# Patient Record
Sex: Female | Born: 2000 | Race: Black or African American | Hispanic: No | Marital: Single | Smoking: Never smoker
Health system: Southern US, Community
[De-identification: ages and names within clinical notes are randomized; demographics above are authoritative.]

## PROBLEM LIST (undated history)

## (undated) DIAGNOSIS — D649 Anemia, unspecified: Secondary | ICD-10-CM

---
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
## 2020-02-24 DIAGNOSIS — R45851 Suicidal ideations: Secondary | ICD-10-CM | POA: Insufficient documentation

## 2020-11-04 ENCOUNTER — Encounter (HOSPITAL_COMMUNITY): Payer: Self-pay | Admitting: Emergency Medicine

## 2020-11-04 ENCOUNTER — Emergency Department (HOSPITAL_COMMUNITY)
Admission: EM | Admit: 2020-11-04 | Discharge: 2020-11-05 | Disposition: A | Discharge status: Home / Self Care | Payer: BC Managed Care – PPO | Attending: Emergency Medicine | Admitting: Emergency Medicine

## 2020-11-04 ENCOUNTER — Other Ambulatory Visit: Payer: Self-pay

## 2020-11-04 DIAGNOSIS — N939 Abnormal uterine and vaginal bleeding, unspecified: Secondary | ICD-10-CM | POA: Diagnosis not present

## 2020-11-04 DIAGNOSIS — N3000 Acute cystitis without hematuria: Secondary | ICD-10-CM

## 2020-11-04 HISTORY — DX: Anemia, unspecified: D64.9

## 2020-11-04 LAB — BASIC METABOLIC PANEL
Anion gap: 10 (ref 5–15)
BUN: 5 mg/dL — ABNORMAL LOW (ref 6–20)
CO2: 24 mmol/L (ref 22–32)
Calcium: 9.7 mg/dL (ref 8.9–10.3)
Chloride: 103 mmol/L (ref 98–111)
Creatinine, Ser: 0.77 mg/dL (ref 0.44–1.00)
GFR, Estimated: >60 mL/min (ref >60)
Glucose, Bld: 79 mg/dL (ref 70–99)
Potassium: 4 mmol/L (ref 3.5–5.1)
Sodium: 137 mmol/L (ref 135–145)

## 2020-11-04 LAB — CBC WITH DIFFERENTIAL/PLATELET
Abs Immature Granulocytes: 0.01 10*3/uL (ref 0.00–0.07)
Basophils Absolute: 0.1 10*3/uL (ref 0.0–0.1)
Basophils Relative: 1 %
Eosinophils Absolute: 0.2 10*3/uL (ref 0.0–0.5)
Eosinophils Relative: 3 %
HCT: 33.3 % — ABNORMAL LOW (ref 36.0–46.0)
Hemoglobin: 9.8 g/dL — ABNORMAL LOW (ref 12.0–15.0)
Immature Granulocytes: 0 %
Lymphocytes Relative: 45 %
Lymphs Abs: 2.9 10*3/uL (ref 0.7–4.0)
MCH: 22.4 pg — ABNORMAL LOW (ref 26.0–34.0)
MCHC: 29.4 g/dL — ABNORMAL LOW (ref 30.0–36.0)
MCV: 76 fL — ABNORMAL LOW (ref 80.0–100.0)
Monocytes Absolute: 0.5 10*3/uL (ref 0.1–1.0)
Monocytes Relative: 7 %
Neutro Abs: 2.9 10*3/uL (ref 1.7–7.7)
Neutrophils Relative %: 44 %
Platelets: 304 10*3/uL (ref 150–400)
RBC: 4.38 MIL/uL (ref 3.87–5.11)
RDW: 16.4 % — ABNORMAL HIGH (ref 11.5–15.5)
WBC: 6.6 10*3/uL (ref 4.0–10.5)
nRBC: 0 % (ref 0.0–0.2)

## 2020-11-04 LAB — I-STAT BETA HCG BLOOD, ED (MC, WL, AP ONLY): I-stat hCG, quantitative: <5 m[IU]/mL (ref <5)

## 2020-11-04 LAB — URINALYSIS, ROUTINE W REFLEX MICROSCOPIC
Bilirubin Urine: NEGATIVE
Glucose, UA: NEGATIVE mg/dL
Ketones, ur: NEGATIVE mg/dL
Nitrite: POSITIVE — AB
Protein, ur: 30 mg/dL — AB
RBC / HPF: >50 RBC/hpf — ABNORMAL HIGH (ref 0–5)
Specific Gravity, Urine: 1.015 (ref 1.005–1.030)
pH: 6 (ref 5.0–8.0)

## 2020-11-04 NOTE — ED Triage Notes (Signed)
Patient reports vaginal bleeding for 3 days with dysuria , no fever or chills , denies emesis .

## 2020-11-05 ENCOUNTER — Telehealth: Payer: Self-pay | Admitting: *Deleted

## 2020-11-05 ENCOUNTER — Encounter (HOSPITAL_COMMUNITY): Payer: Self-pay | Admitting: Emergency Medicine

## 2020-11-05 LAB — GC/CHLAMYDIA PROBE AMP (~~LOC~~) NOT AT ARMC
Chlamydia: NEGATIVE
Comment: NEGATIVE
Comment: NORMAL
Neisseria Gonorrhea: NEGATIVE

## 2020-11-05 LAB — WET PREP, GENITAL
Clue Cells Wet Prep HPF POC: NONE SEEN
Sperm: NONE SEEN
Trich, Wet Prep: NONE SEEN

## 2020-11-05 MED ORDER — CEPHALEXIN 500 MG PO CAPS: 500.0000 mg | ORAL_CAPSULE | Freq: Four times a day (QID) | ORAL | 0 refills | Status: DC

## 2020-11-05 MED ORDER — FLUCONAZOLE 150 MG PO TABS: 150.0000 mg | ORAL_TABLET | Freq: Once | ORAL | 0 refills | Status: AC

## 2020-11-05 MED ORDER — CEPHALEXIN 250 MG PO CAPS
500.0000 mg | ORAL_CAPSULE | Freq: Once | ORAL | Status: AC
  Administered 2020-11-05: 500 mg via ORAL
  Filled 2020-11-05: qty 2

## 2020-11-05 MED ORDER — FLUCONAZOLE 150 MG PO TABS: 150.0000 mg | ORAL_TABLET | Freq: Once | ORAL | 0 refills | Status: DC

## 2020-11-05 NOTE — Telephone Encounter (Signed)
TOC CM received call from pt about rx. Explained rx were attached to her AVS, and they are blue. She located them and will take to her pharmacy. Isidoro Donning RN CCM, [facility] ED TOC CM [phone]

## 2020-11-05 NOTE — ED Provider Notes (Signed)
[facility] EMERGENCY DEPARTMENT Provider Note   CSN: [id number] Arrival date & time: 11/04/20  2040     History Chief Complaint  Patient presents with  . Vaginal Bleeding    Ashley Wilkins is a 19 y.o. female.  The history is provided by the patient.  Vaginal Bleeding Quality:  Dark red Severity:  Moderate Onset quality:  Gradual Timing:  Constant Progression:  Unchanged Chronicity:  New Menstrual history:  Irregular Possible pregnancy: no   Context: spontaneously   Relieved by:  Nothing Worsened by:  Nothing Ineffective treatments:  None tried Associated symptoms: dysuria   Associated symptoms: no abdominal pain, no dizziness and no fever   Risk factors: no bleeding disorder, no STD, no STD exposure and no terminated pregnancies   Patient presents with vaginal bleeding this evening having had a period at the beginning of the month with some dysuria.  No f/c/r.  No discharge.      Past Medical History:  Diagnosis Date  . Anemia     There are no problems to display for this patient.   History reviewed. No pertinent surgical history.   OB History   No obstetric history on file.     History reviewed. No pertinent family history.  Social History   Tobacco Use  . Smoking status: Never Smoker  . Smokeless tobacco: Never Used  Substance Use Topics  . Alcohol use: Never  . Drug use: Never    Home Medications Prior to Admission medications   Not on File    Allergies    Patient has no known allergies.  Review of Systems   Review of Systems  Constitutional: Negative for fever.  HENT: Negative for congestion.   Eyes: Negative for visual disturbance.  Respiratory: Negative for shortness of breath.   Cardiovascular: Negative for chest pain.  Gastrointestinal: Negative for abdominal pain.  Genitourinary: Positive for dysuria and vaginal bleeding.  Musculoskeletal: Negative for arthralgias.  Skin: Negative for rash.  Neurological:  Negative for dizziness.  Psychiatric/Behavioral: Negative for agitation.  All other systems reviewed and are negative.   Physical Exam Updated Vital Signs BP (!) 148/63   Pulse 84   Temp 98.6 F (37 C) (Oral)   Resp 18   Ht 5\' 8"  (1.727 m)   Wt 113 kg   LMP 10/19/2020   SpO2 100%   BMI 37.88 kg/m   Physical Exam Vitals and nursing note reviewed.  Constitutional:      General: She is not in acute distress.    Appearance: Normal appearance.  HENT:     Head: Normocephalic and atraumatic.     Nose: Nose normal.  Eyes:     Conjunctiva/sclera: Conjunctivae normal.     Pupils: Pupils are equal, round, and reactive to light.  Cardiovascular:     Rate and Rhythm: Normal rate and regular rhythm.     Pulses: Normal pulses.     Heart sounds: Normal heart sounds.  Pulmonary:     Effort: Pulmonary effort is normal.     Breath sounds: Normal breath sounds.  Abdominal:     General: Abdomen is flat. Bowel sounds are normal.     Palpations: Abdomen is soft.     Tenderness: There is no abdominal tenderness. There is no guarding or rebound.     Hernia: No hernia is present.  Musculoskeletal:        General: Normal range of motion.     Cervical back: Normal range of motion and neck  supple.  Skin:    General: Skin is warm and dry.     Capillary Refill: Capillary refill takes less than 2 seconds.  Neurological:     General: No focal deficit present.     Mental Status: She is alert and oriented to person, place, and time.     Deep Tendon Reflexes: Reflexes normal.  Psychiatric:        Mood and Affect: Mood normal.        Behavior: Behavior normal.     ED Results / Procedures / Treatments   Labs (all labs ordered are listed, but only abnormal results are displayed) Results for orders placed or performed during the hospital encounter of 11/04/20  CBC with Differential  Result Value Ref Range   WBC 6.6 4.0 - 10.5 K/uL   RBC 4.38 3.87 - 5.11 MIL/uL   Hemoglobin 9.8 (L) 12.0 -  15.0 g/dL   HCT 77.9 (L) 36 - 46 %   MCV 76.0 (L) 80.0 - 100.0 fL   MCH 22.4 (L) 26.0 - 34.0 pg   MCHC 29.4 (L) 30.0 - 36.0 g/dL   RDW 39.0 (H) 30.0 - 92.3 %   Platelets 304 150 - 400 K/uL   nRBC 0.0 0.0 - 0.2 %   Neutrophils Relative % 44 %   Neutro Abs 2.9 1.7 - 7.7 K/uL   Lymphocytes Relative 45 %   Lymphs Abs 2.9 0.7 - 4.0 K/uL   Monocytes Relative 7 %   Monocytes Absolute 0.5 0.1 - 1.0 K/uL   Eosinophils Relative 3 %   Eosinophils Absolute 0.2 0.0 - 0.5 K/uL   Basophils Relative 1 %   Basophils Absolute 0.1 0.0 - 0.1 K/uL   Immature Granulocytes 0 %   Abs Immature Granulocytes 0.01 0.00 - 0.07 K/uL  Basic metabolic panel  Result Value Ref Range   Sodium 137 135 - 145 mmol/L   Potassium 4.0 3.5 - 5.1 mmol/L   Chloride 103 98 - 111 mmol/L   CO2 24 22 - 32 mmol/L   Glucose, Bld 79 70 - 99 mg/dL   BUN 5 (L) 6 - 20 mg/dL   Creatinine, Ser 3.00 0.44 - 1.00 mg/dL   Calcium 9.7 8.9 - 76.2 mg/dL   GFR, Estimated >26 >33 mL/min   Anion gap 10 5 - 15  Urinalysis, Routine w reflex microscopic Urine, Clean Catch  Result Value Ref Range   Color, Urine YELLOW YELLOW   APPearance HAZY (A) CLEAR   Specific Gravity, Urine 1.015 1.005 - 1.030   pH 6.0 5.0 - 8.0   Glucose, UA NEGATIVE NEGATIVE mg/dL   Hgb urine dipstick LARGE (A) NEGATIVE   Bilirubin Urine NEGATIVE NEGATIVE   Ketones, ur NEGATIVE NEGATIVE mg/dL   Protein, ur 30 (A) NEGATIVE mg/dL   Nitrite POSITIVE (A) NEGATIVE   Leukocytes,Ua TRACE (A) NEGATIVE   RBC / HPF >50 (H) 0 - 5 RBC/hpf   WBC, UA 11-20 0 - 5 WBC/hpf   Bacteria, UA FEW (A) NONE SEEN   Squamous Epithelial / LPF 0-5 0 - 5   Mucus PRESENT   I-Stat Beta hCG blood, ED ([facility], [facility], [facility] only)  Result Value Ref Range   I-stat hCG, quantitative <5.0 <5 mIU/mL   Comment 3           No results found.  EKG None  Radiology No results found.  Procedures Procedures (including critical care time)  Medications Ordered in ED Medications  cephALEXin (KEFLEX)  capsule 500  mg (has no administration in time range)    ED Course  I have reviewed the triage vital signs and the nursing notes.  Pertinent labs & imaging results that were available during my care of the patient were reviewed by me and considered in my medical decision making (see chart for details).   No instrumentation, no recent pregnancy.  Likely change of menstrual cycle. Patient has a UTI which we are treating with keflex.  If vaginal bleeding persists follow up with GYN.     Brycelyn Rayfield was evaluated in Emergency Department on 11/05/2020 for the symptoms described in the history of present illness. She was evaluated in the context of the global COVID-19 pandemic, which necessitated consideration that the patient might be at risk for infection with the SARS-CoV-2 virus that causes COVID-19. Institutional protocols and algorithms that pertain to the evaluation of patients at risk for COVID-19 are in a state of rapid change based on information released by regulatory bodies including the CDC and federal and state organizations. These policies and algorithms were followed during the patient's care in the ED.  Final Clinical Impression(s) / ED Diagnoses Return for intractable cough, coughing up blood,fevers >100.4 unrelieved by medication, shortness of breath, intractable vomiting, chest pain, shortness of breath, weakness,numbness, changes in speech, facial asymmetry,abdominal pain, passing out,Inability to tolerate liquids or food, cough, altered mental status or any concerns. No signs of systemic illness or infection. The patient is nontoxic-appearing on exam and vital signs are within normal limits.   I have reviewed the triage vital signs and the nursing notes. Pertinent labs &imaging results that were available during my care of the patient were reviewed by me and considered in my medical decision making (see chart for details).After history, exam, and medical workup I feel the  patient has beenappropriately medically screened and is safe for discharge home. Pertinent diagnoses were discussed with the patient. Patient was given return precautions.   Keian Odriscoll, MD 11/05/20 [id number]

## 2021-04-12 ENCOUNTER — Other Ambulatory Visit: Payer: Self-pay

## 2021-04-12 ENCOUNTER — Emergency Department (HOSPITAL_COMMUNITY): Payer: 59

## 2021-04-12 ENCOUNTER — Encounter (HOSPITAL_COMMUNITY): Payer: Self-pay

## 2021-04-12 ENCOUNTER — Emergency Department (HOSPITAL_COMMUNITY)
Admission: EM | Admit: 2021-04-12 | Discharge: 2021-04-12 | Disposition: A | Discharge status: Home / Self Care | Payer: 59 | Attending: Emergency Medicine | Admitting: Emergency Medicine

## 2021-04-12 DIAGNOSIS — Z20822 Contact with and (suspected) exposure to covid-19: Secondary | ICD-10-CM | POA: Diagnosis not present

## 2021-04-12 DIAGNOSIS — R112 Nausea with vomiting, unspecified: Secondary | ICD-10-CM | POA: Insufficient documentation

## 2021-04-12 DIAGNOSIS — R0989 Other specified symptoms and signs involving the circulatory and respiratory systems: Secondary | ICD-10-CM | POA: Diagnosis not present

## 2021-04-12 DIAGNOSIS — R002 Palpitations: Secondary | ICD-10-CM | POA: Insufficient documentation

## 2021-04-12 DIAGNOSIS — R1084 Generalized abdominal pain: Secondary | ICD-10-CM | POA: Insufficient documentation

## 2021-04-12 DIAGNOSIS — R638 Other symptoms and signs concerning food and fluid intake: Secondary | ICD-10-CM | POA: Diagnosis not present

## 2021-04-12 DIAGNOSIS — R000 Tachycardia, unspecified: Secondary | ICD-10-CM | POA: Insufficient documentation

## 2021-04-12 DIAGNOSIS — M791 Myalgia, unspecified site: Secondary | ICD-10-CM | POA: Diagnosis not present

## 2021-04-12 LAB — CBC WITH DIFFERENTIAL/PLATELET
Abs Immature Granulocytes: 0.04 10*3/uL (ref 0.00–0.07)
Basophils Absolute: 0 10*3/uL (ref 0.0–0.1)
Basophils Relative: 0 %
Eosinophils Absolute: 0.1 10*3/uL (ref 0.0–0.5)
Eosinophils Relative: 1 %
HCT: 28.9 % — ABNORMAL LOW (ref 36.0–46.0)
Hemoglobin: 8.7 g/dL — ABNORMAL LOW (ref 12.0–15.0)
Immature Granulocytes: 0 %
Lymphocytes Relative: 25 %
Lymphs Abs: 2.4 10*3/uL (ref 0.7–4.0)
MCH: 21.6 pg — ABNORMAL LOW (ref 26.0–34.0)
MCHC: 30.1 g/dL (ref 30.0–36.0)
MCV: 71.9 fL — ABNORMAL LOW (ref 80.0–100.0)
Monocytes Absolute: 0.8 10*3/uL (ref 0.1–1.0)
Monocytes Relative: 8 %
Neutro Abs: 6.2 10*3/uL (ref 1.7–7.7)
Neutrophils Relative %: 66 %
Platelets: 268 10*3/uL (ref 150–400)
RBC: 4.02 MIL/uL (ref 3.87–5.11)
RDW: 18.2 % — ABNORMAL HIGH (ref 11.5–15.5)
WBC: 9.5 10*3/uL (ref 4.0–10.5)
nRBC: 0 % (ref 0.0–0.2)

## 2021-04-12 LAB — COMPREHENSIVE METABOLIC PANEL
ALT: 16 U/L (ref 0–44)
AST: 19 U/L (ref 15–41)
Albumin: 3.3 g/dL — ABNORMAL LOW (ref 3.5–5.0)
Alkaline Phosphatase: 46 U/L (ref 38–126)
Anion gap: 11 (ref 5–15)
BUN: 9 mg/dL (ref 6–20)
CO2: 24 mmol/L (ref 22–32)
Calcium: 9.2 mg/dL (ref 8.9–10.3)
Chloride: 99 mmol/L (ref 98–111)
Creatinine, Ser: 0.93 mg/dL (ref 0.44–1.00)
GFR, Estimated: >60 mL/min (ref >60)
Glucose, Bld: 111 mg/dL — ABNORMAL HIGH (ref 70–99)
Potassium: 3.5 mmol/L (ref 3.5–5.1)
Sodium: 134 mmol/L — ABNORMAL LOW (ref 135–145)
Total Bilirubin: 0.7 mg/dL (ref 0.3–1.2)
Total Protein: 7.5 g/dL (ref 6.5–8.1)

## 2021-04-12 LAB — RESP PANEL BY RT-PCR (FLU A&B, COVID) ARPGX2
Influenza A by PCR: NEGATIVE
Influenza B by PCR: NEGATIVE
SARS Coronavirus 2 by RT PCR: NEGATIVE

## 2021-04-12 LAB — LIPASE, BLOOD: Lipase: 26 U/L (ref 11–51)

## 2021-04-12 LAB — TSH: TSH: 1.925 u[IU]/mL (ref 0.350–4.500)

## 2021-04-12 LAB — I-STAT BETA HCG BLOOD, ED (MC, WL, AP ONLY): I-stat hCG, quantitative: <5 m[IU]/mL (ref <5)

## 2021-04-12 MED ORDER — SODIUM CHLORIDE 0.9 % IV BOLUS
1000.0000 mL | Freq: Once | INTRAVENOUS | Status: AC
  Administered 2021-04-12: 1000 mL via INTRAVENOUS

## 2021-04-12 MED ORDER — IBUPROFEN 400 MG PO TABS
400.0000 mg | ORAL_TABLET | Freq: Once | ORAL | Status: AC
  Administered 2021-04-12: 400 mg via ORAL
  Filled 2021-04-12: qty 1

## 2021-04-12 MED ORDER — ONDANSETRON 4 MG PO TBDP: 4.0000 mg | ORAL_TABLET | Freq: Three times a day (TID) | ORAL | 0 refills | Status: DC | PRN

## 2021-04-12 NOTE — ED Provider Notes (Signed)
[facility] EMERGENCY DEPARTMENT Provider Note   CSN: [id number] Arrival date & time: 04/12/21  0175     History Chief Complaint  Patient presents with  . Palpitations  . Emesis    Ashley Wilkins is a 20 y.o. female with past medical history significant for anemia.  HPI Patient presents to emergency room today via EMS with chief complaint of palpitations and emesis x1 week.  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  She has had increased stress lately because of school.  She denies any suicidal or homicidal ideations.  She describes her palpitations as feeling like her heart was racing and that she was going to pass out.  Patient also endorses generalized abdominal pain, nausea, emesis, generalized body aches.  She has had 4 episodes of nonbloody nonbilious emesis in the last 24 hours.  She does not she is unable to tolerate any p.o. intake.  She states her last full meal was x3 days ago.  She has been taking Tylenol and Motrin.  Her last dose of Tylenol was just prior to arrival.  Patient states she also has congestion.  She had a negative home COVID test 2 days ago.  Patient denies any family history of cardiac disease.  She states she is supposed to be on iron pills however has not taken them in quite some time.  She denies fever, chills, congestion, hematemesis, chest pain, syncope, urinary symptoms, diarrhea, blood in stool.  Denies any drug or alcohol use.  Last bowel movement was today and was normal.    Past Medical History:  Diagnosis Date  . Anemia     There are no problems to display for this patient.   History reviewed. No pertinent surgical history.   OB History   No obstetric history on file.     History reviewed. No pertinent family history.  Social History   Tobacco Use  . Smoking status: Never Smoker  . Smokeless tobacco: Never Used  Substance Use Topics  . Alcohol use: Never  . Drug use: Never    Home Medications Prior to Admission  medications   Medication Sig Start Date End Date Taking? Authorizing Provider  acetaminophen (TYLENOL) 325 MG tablet Take 650 mg by mouth every 6 (six) hours as needed for mild pain, fever or headache.   Yes [provider]  ibuprofen (ADVIL) 200 MG tablet Take 400 mg by mouth every 6 (six) hours as needed for fever, headache or mild pain.   Yes [provider]  ondansetron (ZOFRAN ODT) 4 MG disintegrating tablet Take 1 tablet (4 mg total) by mouth every 8 (eight) hours as needed for nausea or vomiting. 04/12/21  Yes Walisiewicz, Leslieanne Cobarrubias E, PA-C  WELLBUTRIN XL 150 MG 24 hr tablet Take 150 mg by mouth daily. 02/13/21  Yes [provider]  ziprasidone (GEODON) 40 MG capsule Take 40 mg by mouth at bedtime. 02/13/21  Yes [provider]  cephALEXin (KEFLEX) 500 MG capsule Take 1 capsule (500 mg total) by mouth 4 (four) times daily. Patient not taking: Reported on 04/12/2021 11/05/20   Nicanor Alcon, April, MD    Allergies    Patient has no known allergies.  Review of Systems   Review of Systems All other systems are reviewed and are negative for acute change except as noted in the HPI.  Physical Exam Updated Vital Signs BP 122/80   Pulse (!) 112   Temp 98.2 F (36.8 C) (Oral)   Resp 20  Ht 5\' 8"  (1.727 m)   Wt 99.8 kg   SpO2 99%   BMI 33.45 kg/m   Physical Exam Vitals and nursing note reviewed.  Constitutional:      General: She is not in acute distress.    Appearance: She is not ill-appearing.  HENT:     Head: Normocephalic and atraumatic.     Right Ear: Tympanic membrane and external ear normal.     Left Ear: Tympanic membrane and external ear normal.     Nose: Nose normal.     Mouth/Throat:     Mouth: Mucous membranes are dry.     Pharynx: Oropharynx is clear.  Eyes:     General: No scleral icterus.       Right eye: No discharge.        Left eye: No discharge.     Extraocular Movements: Extraocular movements intact.     Conjunctiva/sclera:  Conjunctivae normal.     Pupils: Pupils are equal, round, and reactive to light.  Neck:     Vascular: No JVD.  Cardiovascular:     Rate and Rhythm: Regular rhythm. Tachycardia present.     Pulses: Normal pulses.          Radial pulses are 2+ on the right side and 2+ on the left side.     Heart sounds: Normal heart sounds.  Pulmonary:     Comments: Lungs clear to auscultation in all fields. Symmetric chest rise. No wheezing, rales, or rhonchi. Abdominal:     Tenderness: There is no right CVA tenderness or left CVA tenderness.     Comments: Abdomen is soft, non-distended, and non-tender in all quadrants. No rigidity, no guarding. No peritoneal signs.  Musculoskeletal:        General: No swelling. Normal range of motion.     Cervical back: Normal range of motion.  Skin:    General: Skin is warm and dry.     Capillary Refill: Capillary refill takes less than 2 seconds.     Findings: No rash.  Neurological:     Mental Status: She is oriented to person, place, and time.     GCS: GCS eye subscore is 4. GCS verbal subscore is 5. GCS motor subscore is 6.     Comments: Fluent speech, no facial droop.  Psychiatric:        Behavior: Behavior normal.     ED Results / Procedures / Treatments   Labs (all labs ordered are listed, but only abnormal results are displayed) Labs Reviewed  CBC WITH DIFFERENTIAL/PLATELET - Abnormal; Notable for the following components:      Result Value   Hemoglobin 8.7 (*)    HCT 28.9 (*)    MCV 71.9 (*)    MCH 21.6 (*)    RDW 18.2 (*)    All other components within normal limits  COMPREHENSIVE METABOLIC PANEL - Abnormal; Notable for the following components:   Sodium 134 (*)    Glucose, Bld 111 (*)    Albumin 3.3 (*)    All other components within normal limits  RESP PANEL BY RT-PCR (FLU A&B, COVID) ARPGX2  RESP PANEL BY RT-PCR (FLU A&B, COVID) ARPGX2  LIPASE, BLOOD  TSH  I-STAT BETA HCG BLOOD, ED ([facility], [facility], [facility] ONLY)    EKG EKG  Interpretation  Date/Time:  Sunday April 12 2021 08:50:10 EDT Ventricular Rate:  112 PR Interval:  152 QRS Duration: 94 QT Interval:  335 QTC Calculation: 458 R Axis:  55 Text Interpretation: Sinus tachycardia RSR' in V1 or V2, probably normal variant No old tracing to compare Confirmed by Pricilla Loveless [phone]) on 04/12/2021 9:55:49 AM   Radiology DG Chest Portable 1 View  Result Date: 04/12/2021 CLINICAL DATA:  Cough EXAM: PORTABLE CHEST 1 VIEW COMPARISON:  None. FINDINGS: The heart size and mediastinal contours are within normal limits. No focal airspace consolidation, pleural effusion, or pneumothorax. The visualized skeletal structures are unremarkable. IMPRESSION: No active disease. Electronically Signed   By: Duanne Guess D.O.   On: 04/12/2021 09:55    Procedures Procedures   Medications Ordered in ED Medications  sodium chloride 0.9 % bolus 1,000 mL (1,000 mLs Intravenous New Bag/Given 04/12/21 0940)  ibuprofen (ADVIL) tablet 400 mg (400 mg Oral Given 04/12/21 6962)    ED Course  I have reviewed the triage vital signs and the nursing notes.  Pertinent labs & imaging results that were available during my care of the patient were reviewed by me and considered in my medical decision making (see chart for details). Vitals:   04/12/21 1100 04/12/21 1115 04/12/21 1130 04/12/21 1145  BP: 135/85 134/81 133/77 130/78  Pulse: 87 96 86 91  Resp: 19 15 17 17   Temp:      TempSrc:      SpO2: 99% 98% 99% 99%  Weight:      Height:          MDM Rules/Calculators/A&P                          History provided by patient with additional history obtained from chart review.    Patient looks to not feel well however is nontoxic appearing.  On ED arrival she is afebrile, she is tachycardic to 125.  On my exam patient is resting comfortably on stretcher.  Heart rate is in the 110s.  She has normal work of breathing.  Lungs are clear to auscultation all fields.  She has no abdominal  tenderness, no peritoneal signs.  She does look dehydrated, mucous membranes are dry.  Work-up initiated with basic labs including TSH, chest x-ray, EKG.  Will give patient a liter of IV fluids, ibuprofen, and Zofran.  She took 200 mg ibuprofen prior to arrival therefore was given 400 mg here.  EKG shows sinus tachycardia.  Patient placed on cardiac monitor.  I viewed pt's chest xray and it does not suggest acute infectious processes.  Agree with radiologist impression.  CBC without leukocytosis, normal platelets, hemoglobin is 8.7, patient has known history of anemia.  Labs to compare this to our 3 months ago when it was 9.8.  CMP without significant electrolyte derangement, no renal insufficiency, no transaminitis.  Lipase within normal range.  Pregnancy test is negative.  TSH is within normal limits.  Reassessed patient.  She admits to feeling better.  Heart rate is in the 80s.  Serial abdominal exams have been benign.  Low suspicion for acute surgical abdomen.  She is tolerating p.o. intake.  She is no longer tachycardic.  Do not feel her tachycardia is suggestive of symptomatic anemia as she has no current bleeding. COVID and flu test were collected and sent to the lab.  I noticed that the swab was not in process and RN called to check on results.  Somehow the swab was lost and never made it to the lab for testing.  Lab tech will credit the swab and patient will need to be retested.  Patient  agreeable with plan of care.   Patient shared decision making and patient feels she can manage her symptoms at home.  She no longer has feeling of palpitations or heart racing.  Commended she start taking her iron supplements again.  Will discharge home with prescription for Zofran.  Patient aware results will be available online in her MyChart.  She knows to quarantine until she has the results and we discussed symptomatic treatment if tests are positive.  Patient with likely underlying viral illness causing  symptoms.  The patient appears reasonably screened and/or stabilized for discharge and I doubt any other medical condition or other [facility] requiring further screening, evaluation, or treatment in the ED at this time prior to discharge. The patient is safe for discharge with strict return precautions discussed. Recommend pcp follow up if symptoms persist.    Portions of this note were generated with Dragon dictation software. Dictation errors may occur despite best attempts at proofreading.   Final Clinical Impression(s) / ED Diagnoses Final diagnoses:  Non-intractable vomiting with nausea, unspecified vomiting type    Rx / DC Orders ED Discharge Orders         Ordered    ondansetron (ZOFRAN ODT) 4 MG disintegrating tablet  Every 8 hours PRN        04/12/21 1155           Shanon AceWalisiewicz, Amana Bouska E, PA-C 04/12/21 1158    Pricilla LovelessGoldston, Scott, MD 04/14/21 [phone]

## 2021-04-12 NOTE — Discharge Instructions (Addendum)
-  Your COVID and flu test result in the next 2 hours.  The results to be available online for you to see in your MyChart.  If COVID is positive need to quarantine per CDC guidelines.  If testing is negative you likely have a viral illness still.  As we discussed you should treat your symptoms with Tylenol and Motrin.  Prescription for Zofran sent to the pharmacy.  This is for nausea.  Take if needed.  Stay well-hydrated.  Drink plenty of fluids.  Advance your diet as tolerated.   Return to the ER for any new or worsening symptoms.

## 2021-04-12 NOTE — ED Notes (Signed)
Pt ambulated to the bathroom on her own power, gait even and steady, no assistance required

## 2021-04-12 NOTE — ED Triage Notes (Signed)
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 She called EMS due to feeling "like her heart was racing and she was going to pass out" per EMS. She reports school stressors and hx of bipolar and misses meds occasionally. She reports she took prozac yesterday but has not taken the other medication for several days. Pt also reports epigastric pain, onset last Tuesday as well as congestion, vomiting, and flu-like symptoms, Reports her last meal was Thursday and has not been able to keep anything down. Reports she took tylenol this morning but vomited it up. Pt a/0 x4. BP 150/110 HR 122  O2 97 RA CBG 174

## 2021-04-12 NOTE — ED Notes (Signed)
[facility] states that they never received covid/flu PCR sent at the same time as the blood that has resulted. Second swab obtained and sent to lab at this time. Pt given sprite for PO challenge and instructed on challenge, verbalized understanding. Pt tolerated PO ibuprofen and water earlier.

## 2021-04-13 ENCOUNTER — Emergency Department (HOSPITAL_BASED_OUTPATIENT_CLINIC_OR_DEPARTMENT_OTHER)
Admission: EM | Admit: 2021-04-13 | Discharge: 2021-04-13 | Disposition: A | Discharge status: Home / Self Care | Payer: BC Managed Care – PPO | Attending: Emergency Medicine | Admitting: Emergency Medicine

## 2021-04-13 ENCOUNTER — Encounter (HOSPITAL_BASED_OUTPATIENT_CLINIC_OR_DEPARTMENT_OTHER): Payer: Self-pay

## 2021-04-13 DIAGNOSIS — R112 Nausea with vomiting, unspecified: Secondary | ICD-10-CM

## 2021-04-13 DIAGNOSIS — R1110 Vomiting, unspecified: Secondary | ICD-10-CM | POA: Diagnosis present

## 2021-04-13 LAB — URINALYSIS, ROUTINE W REFLEX MICROSCOPIC
Bilirubin Urine: NEGATIVE
Glucose, UA: NEGATIVE mg/dL
Ketones, ur: NEGATIVE mg/dL
Nitrite: NEGATIVE
Protein, ur: >300 mg/dL — AB
RBC / HPF: >50 RBC/hpf — ABNORMAL HIGH (ref 0–5)
Specific Gravity, Urine: >1.046 — ABNORMAL HIGH (ref 1.005–1.030)
WBC, UA: >50 WBC/hpf — ABNORMAL HIGH (ref 0–5)
pH: 6 (ref 5.0–8.0)

## 2021-04-13 MED ORDER — KETOROLAC TROMETHAMINE 30 MG/ML IJ SOLN
30.0000 mg | Freq: Once | INTRAMUSCULAR | Status: AC
  Administered 2021-04-13: 30 mg via INTRAMUSCULAR
  Filled 2021-04-13: qty 1

## 2021-04-13 MED ORDER — ONDANSETRON 4 MG PO TBDP
ORAL_TABLET | ORAL | Status: AC
  Administered 2021-04-13: 4 mg via ORAL
  Filled 2021-04-13: qty 1

## 2021-04-13 MED ORDER — ONDANSETRON 4 MG PO TBDP
4.0000 mg | ORAL_TABLET | Freq: Once | ORAL | Status: AC
  Administered 2021-04-13: 4 mg via ORAL
  Filled 2021-04-13: qty 1

## 2021-04-13 MED ORDER — ONDANSETRON 4 MG PO TBDP: 4.0000 mg | ORAL_TABLET | Freq: Once | ORAL | Status: AC

## 2021-04-13 NOTE — ED Notes (Signed)
Pt verbalizes understanding of discharge instructions. Opportunity for questioning and answers were provided. Armand removed by staff, pt discharged from ED to home.  

## 2021-04-13 NOTE — ED Notes (Signed)
Pt states that she will be using [employer] services for her ride home.

## 2021-04-13 NOTE — ED Notes (Signed)
UA cup given and instructed how to use.

## 2021-04-13 NOTE — ED Provider Notes (Signed)
[facility] EMERGENCY DEPT Provider Note   CSN: [id number] Arrival date & time: 04/13/21  0055     History Chief Complaint  Patient presents with  . Nausea  . Emesis    Ashley Wilkins is a 20 y.o. female.  HPI     This a 20 year old female with a history of anemia who presents with ongoing vomiting.  Patient was seen and evaluated at [facility] yesterday morning.  At that time she had full work-up including COVID and influenza testing.  She was discharged with Zofran.  However, she has been unable to get that prescription filled.  She reports she is continue to have nonbilious, nonbloody emesis at her campus apartment.  She reports diffuse abdominal pain that is crampy "all over."  She is not had any diarrhea.  She states that she has a headache and has had some fevers up to 103 during this time.  She has not attempted to take any other medications as "I just vomit it up."  No known sick contacts or COVID exposures.  She is vaccinated against COVID-19.  Chart reviewed from yesterday morning.  Lab work-up mostly reassuring.  No leukocytosis.  No significant metabolic derangements.  COVID and influenza testing negative.  Beta-hCG negative.  Past Medical History:  Diagnosis Date  . Anemia     There are no problems to display for this patient.   History reviewed. No pertinent surgical history.   OB History   No obstetric history on file.     No family history on file.  Social History   Tobacco Use  . Smoking status: Never Smoker  . Smokeless tobacco: Never Used  Substance Use Topics  . Alcohol use: Never  . Drug use: Never    Home Medications Prior to Admission medications   Medication Sig Start Date End Date Taking? Authorizing Provider  acetaminophen (TYLENOL) 325 MG tablet Take 650 mg by mouth every 6 (six) hours as needed for mild pain, fever or headache.    [provider]  cephALEXin (KEFLEX) 500 MG capsule Take 1 capsule (500 mg total)  by mouth 4 (four) times daily. Patient not taking: Reported on 04/12/2021 11/05/20   Palumbo, April, MD  ibuprofen (ADVIL) 200 MG tablet Take 400 mg by mouth every 6 (six) hours as needed for fever, headache or mild pain.    [provider]  ondansetron (ZOFRAN ODT) 4 MG disintegrating tablet Take 1 tablet (4 mg total) by mouth every 8 (eight) hours as needed for nausea or vomiting. 04/12/21   Shanon Ace, PA-C  WELLBUTRIN XL 150 MG 24 hr tablet Take 150 mg by mouth daily. 02/13/21   [provider]  ziprasidone (GEODON) 40 MG capsule Take 40 mg by mouth at bedtime. 02/13/21   [provider]    Allergies    Patient has no known allergies.  Review of Systems   Review of Systems  Constitutional: Positive for fever.  Respiratory: Negative for shortness of breath.   Cardiovascular: Negative for chest pain.  Gastrointestinal: Positive for abdominal pain, nausea and vomiting. Negative for diarrhea.  Genitourinary: Negative for dysuria.  All other systems reviewed and are negative.   Physical Exam Updated Vital Signs BP (!) 146/109   Pulse 64   Temp 98.1 F (36.7 C) (Oral)   Resp 16   Ht 1.727 m (5\' 8" )   Wt 99.8 kg   LMP 04/05/2021   SpO2 100%   BMI 33.45 kg/m   Physical Exam  Vitals and nursing note reviewed.  Constitutional:      Appearance: She is well-developed. She is obese.  HENT:     Head: Normocephalic and atraumatic.     Nose: Nose normal.     Mouth/Throat:     Mouth: Mucous membranes are moist.  Eyes:     Pupils: Pupils are equal, round, and reactive to light.  Cardiovascular:     Rate and Rhythm: Normal rate and regular rhythm.     Heart sounds: Normal heart sounds.  Pulmonary:     Effort: Pulmonary effort is normal. No respiratory distress.     Breath sounds: No wheezing.  Abdominal:     General: Bowel sounds are normal.     Palpations: Abdomen is soft.     Tenderness: There is no abdominal tenderness. There is no guarding  or rebound.  Musculoskeletal:     Cervical back: Neck supple.     Right lower leg: No edema.     Left lower leg: No edema.  Skin:    General: Skin is warm and dry.  Neurological:     Mental Status: She is alert and oriented to person, place, and time.  Psychiatric:        Mood and Affect: Mood normal.     ED Results / Procedures / Treatments   Labs (all labs ordered are listed, but only abnormal results are displayed) Labs Reviewed  URINALYSIS, ROUTINE W REFLEX MICROSCOPIC - Abnormal; Notable for the following components:      Result Value   APPearance HAZY (*)    Specific Gravity, Urine >1.046 (*)    Hgb urine dipstick MODERATE (*)    Protein, ur >300 (*)    Leukocytes,Ua SMALL (*)    RBC / HPF >50 (*)    WBC, UA >50 (*)    Bacteria, UA FEW (*)    Non Squamous Epithelial 0-5 (*)    All other components within normal limits  URINE CULTURE    EKG None  Radiology DG Chest Portable 1 View  Result Date: 04/12/2021 CLINICAL DATA:  Cough EXAM: PORTABLE CHEST 1 VIEW COMPARISON:  None. FINDINGS: The heart size and mediastinal contours are within normal limits. No focal airspace consolidation, pleural effusion, or pneumothorax. The visualized skeletal structures are unremarkable. IMPRESSION: No active disease. Electronically Signed   By: Duanne Guess D.O.   On: 04/12/2021 09:55    Procedures Procedures   Medications Ordered in ED Medications  ondansetron (ZOFRAN-ODT) disintegrating tablet 4 mg (4 mg Oral Given 04/13/21 0112)  ketorolac (TORADOL) 30 MG/ML injection 30 mg (30 mg Intramuscular Given 04/13/21 0112)    ED Course  I have reviewed the triage vital signs and the nursing notes.  Pertinent labs & imaging results that were available during my care of the patient were reviewed by me and considered in my medical decision making (see chart for details).  Clinical Course as of 04/13/21 0159  Mon Apr 13, 2021  0158 On recheck, patient is tolerating fluids without  difficulty.  Urinalysis is a dirty specimen with greater than 50 red and white cells.  Few bacteria.  Patient is not having any urinary symptoms.  Will culture but will not treat as she is asymptomatic otherwise.  Recommended that patient drink frequent low-volume fluids.  She needs to pick up her Zofran at her pharmacy as previously sent. [CH]    Clinical Course User Index [CH] Josie Mesa, Mayer Masker, MD   MDM Rules/Calculators/A&P  Patient presents with ongoing nausea and vomiting.  Was seen and evaluated for the same yesterday.  She has not been able to get her Zofran.  She is overall nontoxic and vital signs are reassuring.  She is afebrile.  She does not appear significantly dehydrated and her pulse rate is in the 60s.  Earlier yesterday it was in the 120s.  She received fluids and Zofran while in the emergency room.  She had reassuring basic lab work, negative flu and COVID testing.  Given benign exam, she likely still just need symptom control.  Have low suspicion for intra-abdominal pathology such as obstruction or appendicitis.  We will send urine as this was not sent earlier today.  Urinalysis appears to be a dirty specimen with squamous epithelial cells.  Patient is asymptomatic.  We will culture but will not treat at this time.  Patient is able to tolerate fluids with Zofran ODT and IM Toradol.  Her exam remains benign.  I encouraged the patient to please pick up her medications as previously prescribed.  Symptoms are likely viral in nature given reports of fever.  After history, exam, and medical workup I feel the patient has been appropriately medically screened and is safe for discharge home. Pertinent diagnoses were discussed with the patient. Patient was given return precautions.  Final Clinical Impression(s) / ED Diagnoses Final diagnoses:  Non-intractable vomiting with nausea, unspecified vomiting type    Rx / DC Orders ED Discharge Orders    None        Shon Baton, MD 04/13/21 0201

## 2021-04-13 NOTE — Discharge Instructions (Addendum)
You were seen today for nausea and vomiting.  It is important that you stay hydrated by drinking small volumes of fluid throughout the day.  Please pick up your medications from the pharmacy.

## 2021-04-13 NOTE — ED Triage Notes (Addendum)
Pt is present to the ED for nausea/vomiting. Pt also c/o generalized abd pain. Pt was seen earlier at [facility] and was d/c with ondansetron but was unable to pick up Rx due to pharmacy being closed.   Last episode of emesis was PTA.

## 2021-04-14 LAB — URINE CULTURE

## 2021-10-08 ENCOUNTER — Emergency Department (HOSPITAL_COMMUNITY)
Admission: EM | Admit: 2021-10-08 | Discharge: 2021-10-09 | Disposition: A | Discharge status: Other Acute Inpatient Hospital | Payer: 59 | Source: Home / Self Care | Attending: Emergency Medicine | Admitting: Emergency Medicine

## 2021-10-08 DIAGNOSIS — R000 Tachycardia, unspecified: Secondary | ICD-10-CM | POA: Insufficient documentation

## 2021-10-08 DIAGNOSIS — R45851 Suicidal ideations: Secondary | ICD-10-CM | POA: Insufficient documentation

## 2021-10-08 DIAGNOSIS — Z20822 Contact with and (suspected) exposure to covid-19: Secondary | ICD-10-CM | POA: Insufficient documentation

## 2021-10-08 DIAGNOSIS — F314 Bipolar disorder, current episode depressed, severe, without psychotic features: Secondary | ICD-10-CM | POA: Insufficient documentation

## 2021-10-08 DIAGNOSIS — T50902A Poisoning by unspecified drugs, medicaments and biological substances, intentional self-harm, initial encounter: Secondary | ICD-10-CM

## 2021-10-08 DIAGNOSIS — T426X2A Poisoning by other antiepileptic and sedative-hypnotic drugs, intentional self-harm, initial encounter: Secondary | ICD-10-CM | POA: Insufficient documentation

## 2021-10-08 DIAGNOSIS — T1491XA Suicide attempt, initial encounter: Secondary | ICD-10-CM

## 2021-10-08 LAB — RAPID URINE DRUG SCREEN, HOSP PERFORMED
Amphetamines: NOT DETECTED
Barbiturates: NOT DETECTED
Benzodiazepines: NOT DETECTED
Cocaine: NOT DETECTED
Opiates: NOT DETECTED
Tetrahydrocannabinol: POSITIVE — AB

## 2021-10-08 LAB — I-STAT BETA HCG BLOOD, ED (MC, WL, AP ONLY): I-stat hCG, quantitative: <5 m[IU]/mL (ref <5)

## 2021-10-08 LAB — COMPREHENSIVE METABOLIC PANEL
ALT: 10 U/L (ref 0–44)
AST: 26 U/L (ref 15–41)
Albumin: 3.7 g/dL (ref 3.5–5.0)
Alkaline Phosphatase: 47 U/L (ref 38–126)
Anion gap: 9 (ref 5–15)
BUN: 8 mg/dL (ref 6–20)
CO2: 23 mmol/L (ref 22–32)
Calcium: 8.6 mg/dL — ABNORMAL LOW (ref 8.9–10.3)
Chloride: 104 mmol/L (ref 98–111)
Creatinine, Ser: 0.82 mg/dL (ref 0.44–1.00)
GFR, Estimated: >60 mL/min (ref >60)
Glucose, Bld: 116 mg/dL — ABNORMAL HIGH (ref 70–99)
Potassium: 3 mmol/L — ABNORMAL LOW (ref 3.5–5.1)
Sodium: 136 mmol/L (ref 135–145)
Total Bilirubin: 0.3 mg/dL (ref 0.3–1.2)
Total Protein: 7.4 g/dL (ref 6.5–8.1)

## 2021-10-08 LAB — CBC WITH DIFFERENTIAL/PLATELET
Abs Immature Granulocytes: 0.03 10*3/uL (ref 0.00–0.07)
Basophils Absolute: 0.1 10*3/uL (ref 0.0–0.1)
Basophils Relative: 1 %
Eosinophils Absolute: 0.1 10*3/uL (ref 0.0–0.5)
Eosinophils Relative: 2 %
HCT: 28 % — ABNORMAL LOW (ref 36.0–46.0)
Hemoglobin: 8.2 g/dL — ABNORMAL LOW (ref 12.0–15.0)
Immature Granulocytes: 0 %
Lymphocytes Relative: 45 %
Lymphs Abs: 3.6 10*3/uL (ref 0.7–4.0)
MCH: 20.3 pg — ABNORMAL LOW (ref 26.0–34.0)
MCHC: 29.3 g/dL — ABNORMAL LOW (ref 30.0–36.0)
MCV: 69.3 fL — ABNORMAL LOW (ref 80.0–100.0)
Monocytes Absolute: 0.6 10*3/uL (ref 0.1–1.0)
Monocytes Relative: 8 %
Neutro Abs: 3.4 10*3/uL (ref 1.7–7.7)
Neutrophils Relative %: 44 %
Platelets: 541 10*3/uL — ABNORMAL HIGH (ref 150–400)
RBC: 4.04 MIL/uL (ref 3.87–5.11)
RDW: 19.1 % — ABNORMAL HIGH (ref 11.5–15.5)
WBC: 7.9 10*3/uL (ref 4.0–10.5)
nRBC: 0 % (ref 0.0–0.2)

## 2021-10-08 LAB — RESP PANEL BY RT-PCR (FLU A&B, COVID) ARPGX2
Influenza A by PCR: NEGATIVE
Influenza B by PCR: NEGATIVE
SARS Coronavirus 2 by RT PCR: NEGATIVE

## 2021-10-08 LAB — ACETAMINOPHEN LEVEL
Acetaminophen (Tylenol), Serum: <10 ug/mL — ABNORMAL LOW (ref 10–30)
Acetaminophen (Tylenol), Serum: <10 ug/mL — ABNORMAL LOW (ref 10–30)

## 2021-10-08 LAB — SALICYLATE LEVEL: Salicylate Lvl: <7 mg/dL — ABNORMAL LOW (ref 7.0–30.0)

## 2021-10-08 LAB — MAGNESIUM: Magnesium: 2.1 mg/dL (ref 1.7–2.4)

## 2021-10-08 LAB — CBG MONITORING, ED: Glucose-Capillary: 121 mg/dL — ABNORMAL HIGH (ref 70–99)

## 2021-10-08 LAB — ETHANOL: Alcohol, Ethyl (B): <10 mg/dL (ref <10)

## 2021-10-08 MED ORDER — NICOTINE 21 MG/24HR TD PT24
21.0000 mg | MEDICATED_PATCH | Freq: Every day | TRANSDERMAL | Status: DC
Start: 2021-10-08 — End: 2021-10-09
  Filled 2021-10-08 (×2): qty 1

## 2021-10-08 MED ORDER — ALUM & MAG HYDROXIDE-SIMETH 200-200-20 MG/5ML PO SUSP: 30.0000 mL | Freq: Four times a day (QID) | ORAL | Status: DC | PRN

## 2021-10-08 MED ORDER — ACETAMINOPHEN 325 MG PO TABS: 650.0000 mg | ORAL_TABLET | ORAL | Status: DC | PRN

## 2021-10-08 MED ORDER — SODIUM CHLORIDE 0.9 % IV BOLUS
1000.0000 mL | Freq: Once | INTRAVENOUS | Status: AC
  Administered 2021-10-08: 1000 mL via INTRAVENOUS

## 2021-10-08 MED ORDER — POTASSIUM CHLORIDE CRYS ER 20 MEQ PO TBCR
40.0000 meq | EXTENDED_RELEASE_TABLET | Freq: Once | ORAL | Status: AC
  Administered 2021-10-08: 40 meq via ORAL
  Filled 2021-10-08: qty 2

## 2021-10-08 MED ORDER — ONDANSETRON HCL 4 MG PO TABS: 4.0000 mg | ORAL_TABLET | Freq: Three times a day (TID) | ORAL | Status: DC | PRN

## 2021-10-08 MED ORDER — ZOLPIDEM TARTRATE 5 MG PO TABS: 5.0000 mg | ORAL_TABLET | Freq: Every evening | ORAL | Status: DC | PRN

## 2021-10-08 MED ORDER — CHARCOAL ACTIVATED PO LIQD
50.0000 g | Freq: Once | ORAL | Status: AC
  Administered 2021-10-08: 50 g via ORAL
  Filled 2021-10-08: qty 240

## 2021-10-08 NOTE — Progress Notes (Signed)
Pt was accepted to [facility] Friday 10/09/21 after 0900am  Pt meets inpatient criteria per Malachy Chamber, NP  Attending Physician will be Dr. Mason Jim. Diagnosis Bipolar disorder.   Report can be called to: -Adult unit: [phone]  Pt can arrive after 0900am  Care Team notified via secure chat: Nira Conn, NP, Allen Kell, Gretta Arab, RN, Harland Dingwall, and [facility] Rosey Bath, RN.   Kelton Pillar, LCSWA 10/08/2021 @ 11:17 PM

## 2021-10-08 NOTE — ED Provider Notes (Addendum)
Received signout from previous provider, please see his note for complete H&P.  This is a 20 year old female presenting for intentional overdose Lamictal.  She reportedly took 30 tablets of Lamictal with intent to harm herself because she was recently diagnosed with bipolar.  Poison control was contacted and recommendation was given.  Patient has been monitoring for the past 6 hours, she did vomit once but otherwise in no acute discomfort.  She is mentating appropriately.  Her potassium is 3.0, supplementation was given.  Patient was found to be anemic with a hemoglobin of 8.2, similar to prior baseline.  1:54 PM At this time pt is medically cleared and will be manage further by psychiatry for her suicidal attempt.    2:26 PM Nurse report pt requesting to leave.  Due to suicidal attempt, I have filed IVC paper and First Exam.  Pt will need further management by psychiatry.   BP 127/85   Pulse (!) 103   Temp 98.4 F (36.9 C) (Oral)   Resp (!) 25   Ht 5\' 7"  (1.702 m)   Wt 101.6 kg   SpO2 100%   BMI 35.08 kg/m   Results for orders placed or performed during the hospital encounter of 10/08/21  Comprehensive metabolic panel  Result Value Ref Range   Sodium 136 135 - 145 mmol/L   Potassium 3.0 (L) 3.5 - 5.1 mmol/L   Chloride 104 98 - 111 mmol/L   CO2 23 22 - 32 mmol/L   Glucose, Bld 116 (H) 70 - 99 mg/dL   BUN 8 6 - 20 mg/dL   Creatinine, Ser 10/10/21 0.44 - 1.00 mg/dL   Calcium 8.6 (L) 8.9 - 10.3 mg/dL   Total Protein 7.4 6.5 - 8.1 g/dL   Albumin 3.7 3.5 - 5.0 g/dL   AST 26 15 - 41 U/L   ALT 10 0 - 44 U/L   Alkaline Phosphatase 47 38 - 126 U/L   Total Bilirubin 0.3 0.3 - 1.2 mg/dL   GFR, Estimated 6.83 >41 mL/min   Anion gap 9 5 - 15  Salicylate level  Result Value Ref Range   Salicylate Lvl <7.0 (L) 7.0 - 30.0 mg/dL  Acetaminophen level  Result Value Ref Range   Acetaminophen (Tylenol), Serum <10 (L) 10 - 30 ug/mL  Ethanol  Result Value Ref Range   Alcohol, Ethyl (B) <10 <10  mg/dL  Urine rapid drug screen (hosp performed)  Result Value Ref Range   Opiates NONE DETECTED NONE DETECTED   Cocaine NONE DETECTED NONE DETECTED   Benzodiazepines NONE DETECTED NONE DETECTED   Amphetamines NONE DETECTED NONE DETECTED   Tetrahydrocannabinol POSITIVE (A) NONE DETECTED   Barbiturates NONE DETECTED NONE DETECTED  CBC WITH DIFFERENTIAL  Result Value Ref Range   WBC 7.9 4.0 - 10.5 K/uL   RBC 4.04 3.87 - 5.11 MIL/uL   Hemoglobin 8.2 (L) 12.0 - 15.0 g/dL   HCT >96 (L) 22.2 - 97.9 %   MCV 69.3 (L) 80.0 - 100.0 fL   MCH 20.3 (L) 26.0 - 34.0 pg   MCHC 29.3 (L) 30.0 - 36.0 g/dL   RDW 89.2 (H) 11.9 - 41.7 %   Platelets 541 (H) 150 - 400 K/uL   nRBC 0.0 0.0 - 0.2 %   Neutrophils Relative % 44 %   Neutro Abs 3.4 1.7 - 7.7 K/uL   Lymphocytes Relative 45 %   Lymphs Abs 3.6 0.7 - 4.0 K/uL   Monocytes Relative 8 %   Monocytes Absolute 0.6  0.1 - 1.0 K/uL   Eosinophils Relative 2 %   Eosinophils Absolute 0.1 0.0 - 0.5 K/uL   Basophils Relative 1 %   Basophils Absolute 0.1 0.0 - 0.1 K/uL   Immature Granulocytes 0 %   Abs Immature Granulocytes 0.03 0.00 - 0.07 K/uL   Ovalocytes PRESENT   Magnesium  Result Value Ref Range   Magnesium 2.1 1.7 - 2.4 mg/dL  Acetaminophen level  Result Value Ref Range   Acetaminophen (Tylenol), Serum <10 (L) 10 - 30 ug/mL  CBG monitoring, ED  Result Value Ref Range   Glucose-Capillary 121 (H) 70 - 99 mg/dL  I-Stat beta hCG blood, ED  Result Value Ref Range   I-stat hCG, quantitative <5.0 <5 mIU/mL   Comment 3           No results found.    Fayrene Helper, PA-C 10/08/21 1354    Fayrene Helper, PA-C 10/08/21 1427    Tegeler, Canary Brim, MD 10/08/21 [phone]

## 2021-10-08 NOTE — ED Provider Notes (Signed)
[facility]-EMERGENCY DEPT Provider Note   CSN: [id number] Arrival date & time: 10/08/21  5176     History No chief complaint on file.   Ashley Wilkins is a 20 y.o. female.  Patient here with intentional overdose.  She states she took (30) 100 mg tablets of Lamictal with intent to kill herself.  She states that she was recently diagnosed with bipolar and has no desire to live.  She denies any drug or alcohol use.  Denies any other coingestants.  She states that she feels anxious and feels like her heart is racing.  Denies any other symptoms now.  The history is provided by the patient. No language interpreter was used.      Past Medical History:  Diagnosis Date   Anemia     There are no problems to display for this patient.   No past surgical history on file.   OB History   No obstetric history on file.     No family history on file.  Social History   Tobacco Use   Smoking status: Never   Smokeless tobacco: Never  Substance Use Topics   Alcohol use: Never   Drug use: Never    Home Medications Prior to Admission medications   Medication Sig Start Date End Date Taking? Authorizing Provider  acetaminophen (TYLENOL) 325 MG tablet Take 650 mg by mouth every 6 (six) hours as needed for mild pain, fever or headache.    [provider]  cephALEXin (KEFLEX) 500 MG capsule Take 1 capsule (500 mg total) by mouth 4 (four) times daily. Patient not taking: Reported on 04/12/2021 11/05/20   Palumbo, April, MD  ibuprofen (ADVIL) 200 MG tablet Take 400 mg by mouth every 6 (six) hours as needed for fever, headache or mild pain.    [provider]  ondansetron (ZOFRAN ODT) 4 MG disintegrating tablet Take 1 tablet (4 mg total) by mouth every 8 (eight) hours as needed for nausea or vomiting. 04/12/21   Shanon Ace, PA-C  WELLBUTRIN XL 150 MG 24 hr tablet Take 150 mg by mouth daily. 02/13/21   [provider]  ziprasidone  (GEODON) 40 MG capsule Take 40 mg by mouth at bedtime. 02/13/21   [provider]    Allergies    Patient has no known allergies.  Review of Systems   Review of Systems  All other systems reviewed and are negative.  Physical Exam Updated Vital Signs BP (!) 138/94   Pulse (!) 115   Temp 98.4 F (36.9 C) (Oral)   Resp 19   Ht 5\' 7"  (1.702 m)   Wt 101.6 kg   SpO2 99%   BMI 35.08 kg/m   Physical Exam Vitals and nursing note reviewed.  Constitutional:      General: She is not in acute distress.    Appearance: She is well-developed.  HENT:     Head: Normocephalic and atraumatic.  Eyes:     Conjunctiva/sclera: Conjunctivae normal.  Cardiovascular:     Rate and Rhythm: Regular rhythm. Tachycardia present.     Heart sounds: No murmur heard. Pulmonary:     Effort: Pulmonary effort is normal. No respiratory distress.     Breath sounds: Normal breath sounds.  Abdominal:     Palpations: Abdomen is soft.     Tenderness: There is no abdominal tenderness.  Musculoskeletal:        General: Normal range of motion.     Cervical back: Neck supple.  Skin:    General: Skin is warm and dry.  Neurological:     Mental Status: She is alert and oriented to person, place, and time.  Psychiatric:        Mood and Affect: Mood normal.        Behavior: Behavior normal.    ED Results / Procedures / Treatments   Labs (all labs ordered are listed, but only abnormal results are displayed) Labs Reviewed  CBG MONITORING, ED - Abnormal; Notable for the following components:      Result Value   Glucose-Capillary 121 (*)    All other components within normal limits  COMPREHENSIVE METABOLIC PANEL  SALICYLATE LEVEL  ACETAMINOPHEN LEVEL  ETHANOL  RAPID URINE DRUG SCREEN, HOSP PERFORMED  CBC WITH DIFFERENTIAL/PLATELET  I-STAT BETA HCG BLOOD, ED ([facility], [facility], [facility] ONLY)    EKG EKG Interpretation  Date/Time:  Thursday October 08 2021 04:28:47 EDT Ventricular Rate:  117 PR  Interval:  167 QRS Duration: 95 QT Interval:  323 QTC Calculation: 451 R Axis:   16 Text Interpretation: Sinus tachycardia Probable left ventricular hypertrophy Baseline wander in lead(s) I III aVL No significant change was found Confirmed by Glynn Octave [phone]) on 10/08/2021 4:35:43 AM  Radiology No results found.  Procedures Procedures   Medications Ordered in ED Medications  charcoal activated (NO SORBITOL) (ACTIDOSE-AQUA) suspension 50 g (has no administration in time range)    ED Course  I have reviewed the triage vital signs and the nursing notes.  Pertinent labs & imaging results that were available during my care of the patient were reviewed by me and considered in my medical decision making (see chart for details).    MDM Rules/Calculators/A&P                           Patient here with ingestion of (30) 100 mg tablets of Lamictal with intent to kill self.  I called and notified poison control.  Their recommendations are as below.  QRS>120 give sodium bicarb bolus 4 hr aceta Mg Ethanol Cardiac monitor 8 hour obs  They also advised giving activated charcoal.  Patient is still a bit tachycardic.  No seizure activity in the past couple of hours.  Is alert and oriented.  We will give some fluids.  We will continue to monitor.  Patient will need TTS consult for suicide attempt following medical clearance.  Patient will be medically clear at 12:30 PM.  Patient signed out to oncoming team at shift change.  Final Clinical Impression(s) / ED Diagnoses Final diagnoses:  Intentional overdose, initial encounter [facility])    Rx / DC Orders ED Discharge Orders     None        Roxy Horseman, PA-C 10/08/21 0762    Glynn Octave, MD 10/08/21 [phone]

## 2021-10-08 NOTE — ED Notes (Signed)
Pt belongings: yellow shoes, black pants, gold top, black cell phone, 2 set of earring place at nurse desk

## 2021-10-08 NOTE — ED Notes (Signed)
Pt vomit while in restroom
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
## 2021-10-08 NOTE — ED Triage Notes (Signed)
[facility] from home. Pt took approximately 30 100mg  tablets of Lamictal at 0330. Took with intention to harm herself. Pt has vomited twice.

## 2021-10-09 ENCOUNTER — Inpatient Hospital Stay (HOSPITAL_COMMUNITY)
Admission: RE | Admit: 2021-10-09 | Discharge: 2021-10-14 | DRG: 885 | Disposition: A | Discharge status: Home / Self Care | Payer: 59 | Attending: Emergency Medicine | Admitting: Emergency Medicine

## 2021-10-09 ENCOUNTER — Other Ambulatory Visit: Payer: Self-pay | Admitting: Psychiatry

## 2021-10-09 ENCOUNTER — Other Ambulatory Visit: Payer: Self-pay

## 2021-10-09 ENCOUNTER — Encounter (HOSPITAL_COMMUNITY): Payer: Self-pay | Admitting: Family

## 2021-10-09 DIAGNOSIS — D509 Iron deficiency anemia, unspecified: Secondary | ICD-10-CM | POA: Diagnosis present

## 2021-10-09 DIAGNOSIS — R45851 Suicidal ideations: Secondary | ICD-10-CM | POA: Diagnosis present

## 2021-10-09 DIAGNOSIS — Z20822 Contact with and (suspected) exposure to covid-19: Secondary | ICD-10-CM | POA: Diagnosis present

## 2021-10-09 DIAGNOSIS — Z79899 Other long term (current) drug therapy: Secondary | ICD-10-CM | POA: Diagnosis not present

## 2021-10-09 DIAGNOSIS — G4710 Hypersomnia, unspecified: Secondary | ICD-10-CM | POA: Diagnosis present

## 2021-10-09 DIAGNOSIS — F319 Bipolar disorder, unspecified: Secondary | ICD-10-CM | POA: Diagnosis present

## 2021-10-09 DIAGNOSIS — F314 Bipolar disorder, current episode depressed, severe, without psychotic features: Secondary | ICD-10-CM | POA: Diagnosis present

## 2021-10-09 DIAGNOSIS — Z818 Family history of other mental and behavioral disorders: Secondary | ICD-10-CM

## 2021-10-09 DIAGNOSIS — E559 Vitamin D deficiency, unspecified: Secondary | ICD-10-CM | POA: Diagnosis present

## 2021-10-09 DIAGNOSIS — Z23 Encounter for immunization: Secondary | ICD-10-CM | POA: Diagnosis not present

## 2021-10-09 LAB — LAMOTRIGINE LEVEL: Lamotrigine Lvl: 16.3 ug/mL (ref 2.0–20.0)

## 2021-10-09 MED ORDER — QUETIAPINE FUMARATE 50 MG PO TABS
50.0000 mg | ORAL_TABLET | Freq: Every day | ORAL | Status: DC
  Administered 2021-10-09: 50 mg via ORAL
  Filled 2021-10-09 (×3): qty 1

## 2021-10-09 MED ORDER — ALUM & MAG HYDROXIDE-SIMETH 200-200-20 MG/5ML PO SUSP: 30.0000 mL | Freq: Four times a day (QID) | ORAL | Status: DC | PRN

## 2021-10-09 MED ORDER — ONDANSETRON HCL 4 MG PO TABS: 4.0000 mg | ORAL_TABLET | Freq: Three times a day (TID) | ORAL | Status: DC | PRN

## 2021-10-09 MED ORDER — ACETAMINOPHEN 325 MG PO TABS: 650.0000 mg | ORAL_TABLET | ORAL | Status: DC | PRN

## 2021-10-09 MED ORDER — INFLUENZA VAC SPLIT QUAD 0.5 ML IM SUSY
0.5000 mL | PREFILLED_SYRINGE | INTRAMUSCULAR | Status: AC
  Administered 2021-10-10: 0.5 mL via INTRAMUSCULAR
  Filled 2021-10-09: qty 0.5

## 2021-10-09 NOTE — Progress Notes (Signed)
10/09/2021  1040  Report given to Izzy at [facility]

## 2021-10-09 NOTE — Progress Notes (Signed)
10/09/2021  1041 Called police [phone] to transport patient to [facility].

## 2021-10-09 NOTE — ED Notes (Signed)
Patient calm and cooperative this shift.  Mood and affect are depressed.

## 2021-10-09 NOTE — Progress Notes (Signed)
10/09/2021  1016 Called [facility] [phone] to give report waiting for call back.

## 2021-10-09 NOTE — Tx Team (Signed)
Initial Treatment Plan 10/09/2021 7:16 PM Ashley Wilkins [id number]    PATIENT STRESSORS: Educational concerns   Other: Social Media, and recent dx of bipolar d/o     PATIENT STRENGTHS: Motivation for treatment/growth  Supportive family/friends    PATIENT IDENTIFIED PROBLEMS: Anxiety  Depression                   DISCHARGE CRITERIA:  Improved stabilization in mood, thinking, and/or behavior Motivation to continue treatment in a less acute level of care  PRELIMINARY DISCHARGE PLAN: Attend aftercare/continuing care group Outpatient therapy Return to previous living arrangement  PATIENT/FAMILY INVOLVEMENT: This treatment plan has been presented to and reviewed with the patient, Ashley Wilkins. The patient has been given the opportunity to ask questions and make suggestions.  Arlee Muslim, RN 10/09/2021, 7:16 PM

## 2021-10-09 NOTE — Progress Notes (Signed)
Adult Psychoeducational Group Note  Date:  10/09/2021 Time:  8:28 PM  Group Topic/Focus:  Wrap-Up Group:   The focus of this group is to help patients review their daily goal of treatment and discuss progress on daily workbooks.  Participation Level:  Minimal  Participation Quality:  Appropriate  Affect:  Depressed and Flat  Cognitive:  Disorganized  Insight: Appropriate  Engagement in Group:  Limited  Modes of Intervention:  Discussion  Additional Comments:  Pt stated her goal for today was to focus on her treatment plan. Pt stated she accomplished her goal today. Pt stated she did not talk with her doctor or her social worker about her care today. Pt rated her overall day a 3 out of 10. Pt stated she was able to contact her mother today which improved her overall day. Pt stated she felt better about herself tonight. Pt stated she was able to attend all groups held today. Pt stated she was able to attend all meals. Pt stated she took all medications provided today. Pt stated her appetite was poor today. Pt rated her sleep last night was fair. Pt stated the goal tonight was to get some rest. Pt stated she had no physical pain today. Pt deny visual hallucinations and auditory issues tonight. Pt denies thoughts of harming herself or others. Pt stated she would alert staff if anything changed.  Felipa Furnace 10/09/2021, 8:28 PM

## 2021-10-09 NOTE — Progress Notes (Signed)
   10/09/21 2025  Psych Admission Type (Psych Patients Only)  Admission Status Involuntary  Psychosocial Assessment  Patient Complaints None  Eye Contact Brief  Facial Expression Anxious  Affect Anxious  Speech Logical/coherent  Interaction Minimal  Motor Activity Other (Comment) (wnl)  Appearance/Hygiene In scrubs  Behavior Characteristics Cooperative;Anxious  Mood Anxious  Thought Process  Coherency WDL  Content WDL  Delusions None reported or observed  Perception WDL  Hallucination None reported or observed  Judgment Poor  Confusion None  Danger to Self  Current suicidal ideation? Denies  Danger to Others  Danger to Others None reported or observed   Pt seen in dayroom. Pt denies SI, HI, AVH and pain. Pt denies anxiety and depression. Pt asking when she can be discharged. Explained IVC process and encouraged pt to speak with provider in the morning. Pt seems anxious.

## 2021-10-09 NOTE — Progress Notes (Signed)
ADMISSION NOTE:   Pt is a 20 y.o. female who was involuntarily committed to [facility] w/ law enforement transport. Patient reports taking (30) 100 mg tablets of Lamictal with intent to kill herself.  She states that she was recently diagnosed with bipolar and had no desire to live.  She denies any drug or alcohol use. She reports feeling anxious, and feels like her heart is racing.  Denies any other symptoms at present. Pt is alert and oriented x 4. Pt present with calm affect and depressed mood. Pt denies SI, HI and/or AVH at present and is agreeable to notifying staff w/ any safety concerns. Pt stated that her major stressor is from not doing well in school (college), recent dx of bipolar d/o, and social media. Pt denies use of illicit drugs and cigarette. Pt stated that her goals are to "learn why I self destruct" and "learn good ways to cope with my stress."      RN and pt discussed pt's admission plan of care and consents signed.  Pt verbalized understanding of plan of care.  RN oriented pt to the staff, unit, and room.  Skin assessment was completed by O. Clarnce Flock, RN with no negative findings. Pt's belongings secured in locker. Routine safety checks initiated.  Pt is safe on the unit.

## 2021-10-09 NOTE — Group Note (Signed)
LCSW Group Therapy Note  Group Date: 10/09/2021 Start Time: 1300 End Time: 1330   Type of Therapy and Topic:  Group Therapy - Healthy vs Unhealthy Coping Skills  Participation Level:  Active   Description of Group The focus of this group was to determine what unhealthy coping techniques typically are used by group members and what healthy coping techniques would be helpful in coping with various problems. Patients were guided in becoming aware of the differences between healthy and unhealthy coping techniques. Patients were asked to identify 2-3 healthy coping skills they would like to learn to use more effectively.  Therapeutic Goals Patients learned that coping is what human beings do all day long to deal with various situations in their lives Patients defined and discussed healthy vs unhealthy coping techniques Patients identified their preferred coping techniques and identified whether these were healthy or unhealthy Patients determined 2-3 healthy coping skills they would like to become more familiar with and use more often. Patients provided support and ideas to each other   Summary of Patient Progress:   Due to the acuity and complex discharge plans, group was not held. Patient was provided therapeutic worksheets and asked to meet with CSW as needed.    Therapeutic Modalities Cognitive Behavioral Therapy Motivational Interviewing  Tatiyana Foucher M Mialynn Shelvin, LCSWA 10/09/2021  1:19 PM   

## 2021-10-09 NOTE — B[facility] Group Notes (Signed)
Adult Psychoeducational Group Note  Date:  10/09/2021 Time:  5:05 PM  Group Topic/Focus:  Coping With Mental Health Crisis:   The purpose of this group is to help patients identify strategies for coping with mental health crisis.  Group discusses possible causes of crisis and ways to manage them effectively.  Participation Level:  Active  Participation Quality:  Appropriate  Affect:  Appropriate  Cognitive:  Appropriate  Insight: Appropriate  Engagement in Group:  Supportive  Modes of Intervention:  Discussion  Additional Comments:  Pt uses humor as a coping skill   Baldwin Jamaica 10/09/2021, 5:05 PM

## 2021-10-09 NOTE — BH Assessment (Signed)
B[facility] Assessment Progress Note   Per Caryn Bee, NP, this pt requires psychiatric hospitalization.  Danika, RN, [facility] has assigned pt to [facility] Rm 400-1 to the service of Dr Mason Jim.  Pt presents under IVC initiated by EDP Cristal Deer Tegeler,MD and IVC documents have been sent to [facility]  EDP Norman Clay, MD and pt's nurse, Addison Naegeli, have been notified, and Addison Naegeli agrees to call report to [phone].  Pt is to be transported via Patent examiner.   Doylene Canning, [place] Behavioral Health Coordinator [phone]

## 2021-10-10 DIAGNOSIS — F319 Bipolar disorder, unspecified: Secondary | ICD-10-CM

## 2021-10-10 LAB — LIPID PANEL
Cholesterol: 175 mg/dL (ref 0–200)
HDL: 26 mg/dL — ABNORMAL LOW (ref >40)
LDL Cholesterol: 139 mg/dL — ABNORMAL HIGH (ref 0–99)
Total CHOL/HDL Ratio: 6.7 RATIO
Triglycerides: 51 mg/dL (ref <150)
VLDL: 10 mg/dL (ref 0–40)

## 2021-10-10 LAB — HEMOGLOBIN A1C
Hgb A1c MFr Bld: 5.4 % (ref 4.8–5.6)
Mean Plasma Glucose: 108.28 mg/dL

## 2021-10-10 LAB — TSH: TSH: 1.677 u[IU]/mL (ref 0.350–4.500)

## 2021-10-10 MED ORDER — MELATONIN 3 MG PO TABS
3.0000 mg | ORAL_TABLET | Freq: Once | ORAL | Status: AC
  Administered 2021-10-11: 3 mg via ORAL
  Filled 2021-10-10 (×2): qty 1

## 2021-10-10 MED ORDER — ARIPIPRAZOLE 5 MG PO TABS
5.0000 mg | ORAL_TABLET | Freq: Every day | ORAL | Status: DC
  Administered 2021-10-10 – 2021-10-13 (×4): 5 mg via ORAL
  Filled 2021-10-10 (×5): qty 1

## 2021-10-10 MED ORDER — TOPIRAMATE 25 MG PO TABS
25.0000 mg | ORAL_TABLET | Freq: Two times a day (BID) | ORAL | Status: DC
  Administered 2021-10-11 – 2021-10-14 (×7): 25 mg via ORAL
  Filled 2021-10-10 (×10): qty 1
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
## 2021-10-10 NOTE — Progress Notes (Signed)
Pt did not attend Orientation group. 

## 2021-10-10 NOTE — Progress Notes (Addendum)
   10/10/21 1200  Psych Admission Type (Psych Patients Only)  Admission Status Involuntary  Psychosocial Assessment  Patient Complaints None  Eye Contact Brief  Facial Expression Anxious  Affect Anxious  Speech Logical/coherent  Interaction Minimal  Motor Activity Other (Comment) (wnl)  Appearance/Hygiene In scrubs  Behavior Characteristics Cooperative;Anxious  Mood Anxious  Thought Process  Coherency WDL  Content WDL  Delusions None reported or observed  Perception WDL  Hallucination None reported or observed  Judgment Poor  Confusion None  Danger to Self  Current suicidal ideation? Denies  Danger to Others  Danger to Others None reported or observed    D. Pt presents with a sad affect/ depressed mood- calm, cooperative behavior. Pt observed attending rec therapy outside this afternoon, but did not attend morning groups. Pt currently denies SI/HI and AVH  A. Labs and vitals monitored. Pt given and educated on flu vaccine. Pt supported emotionally and encouraged to express concerns and ask questions.   R. Pt remains safe with 15 minute checks. Will continue POC.

## 2021-10-10 NOTE — Progress Notes (Signed)
Adult Psychoeducational Group Note  Date:  10/10/2021 Time:  8:44 PM  Group Topic/Focus:  Wrap-Up Group:   The focus of this group is to help patients review their daily goal of treatment and discuss progress on daily workbooks.  Participation Level:  Active  Participation Quality:  Appropriate  Affect:  Appropriate  Cognitive:  Appropriate  Insight: Appropriate  Engagement in Group:  Improving  Modes of Intervention:  Discussion  Additional Comments:   Pt stated her goal for today was to focus on her treatment plan. Pt stated she accomplished her goal today. Pt stated she talk with her doctor and her social worker about her care today. Pt rated her overall day a 10. Pt stated she was able to contact her mother and grandma today which improved her overall day. Pt stated she felt better about herself tonight. Pt stated she was able to attend all groups held today. Pt stated she was able to attend all meals. Pt stated she took all medications provided today. Pt stated her appetite was good today. Pt rated her sleep last night was good. Pt stated the goal tonight was to get some rest. Pt stated she had no physical pain today. Pt deny visual hallucinations and auditory issues tonight. Pt denies thoughts of harming herself or others. Pt stated she would alert staff if anything changed.  Ashley Wilkins 10/10/2021, 8:44 PM

## 2021-10-10 NOTE — Progress Notes (Signed)
   10/10/21 2327  Psych Admission Type (Psych Patients Only)  Admission Status Involuntary  Psychosocial Assessment  Patient Complaints None  Eye Contact Brief  Facial Expression Flat  Affect Appropriate to circumstance  Speech Logical/coherent  Interaction Minimal  Motor Activity Other (Comment) (WDL)  Appearance/Hygiene In scrubs  Behavior Characteristics Appropriate to situation  Mood Depressed  Thought Process  Coherency WDL  Content WDL  Delusions None reported or observed  Perception WDL  Hallucination None reported or observed  Judgment Poor  Confusion None  Danger to Others  Danger to Others None reported or observed

## 2021-10-10 NOTE — BHH Group Notes (Signed)
.  Psychoeducational Group Note  Date: 10/10/2021 Time: 0900-1000    Goal Setting   Purpose of Group: This group helps to provide patients with the steps of setting Wilkins goal that is specific, measurable, attainable, realistic and time specific. Wilkins discussion on how we keep ourselves stuck with negative self talk.    Participation Level:  Did not attend   Ashley Wilkins 

## 2021-10-10 NOTE — Group Note (Signed)
LCSW Group Therapy Note  No therapy group could be held this day due to staffing issues.  Another licensed group was held.  Hanson Medeiros Grossman-Orr, LCSW 10/10/2021 11:40 AM     

## 2021-10-10 NOTE — [facility] Group Notes (Signed)
.  Psychoeducational Group Note    Date:10/10/2021 Time: 1300-1400    Purpose of Group: . The group focus' on teaching patients on how to identify their needs and how Life Skills:  A group where two lists are made. What people need and what are things that we do that are healthy. The lists are developed by the patients and it is explained that we often do the actions that are not healthy to get our list of needs met.  to develop the coping skills needed to get their needs met  Participation Level:  Did not attend   Paulino Rily
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
## 2021-10-11 ENCOUNTER — Encounter: Payer: Self-pay | Admitting: Psychiatry

## 2021-10-11 LAB — CBC WITH DIFFERENTIAL/PLATELET
Abs Immature Granulocytes: 0.01 10*3/uL (ref 0.00–0.07)
Basophils Absolute: 0 10*3/uL (ref 0.0–0.1)
Basophils Relative: 1 %
Eosinophils Absolute: 0.1 10*3/uL (ref 0.0–0.5)
Eosinophils Relative: 1 %
HCT: 27.4 % — ABNORMAL LOW (ref 36.0–46.0)
Hemoglobin: 8 g/dL — ABNORMAL LOW (ref 12.0–15.0)
Immature Granulocytes: 0 %
Lymphocytes Relative: 38 %
Lymphs Abs: 2.5 10*3/uL (ref 0.7–4.0)
MCH: 20.4 pg — ABNORMAL LOW (ref 26.0–34.0)
MCHC: 29.2 g/dL — ABNORMAL LOW (ref 30.0–36.0)
MCV: 69.9 fL — ABNORMAL LOW (ref 80.0–100.0)
Monocytes Absolute: 0.5 10*3/uL (ref 0.1–1.0)
Monocytes Relative: 8 %
Neutro Abs: 3.4 10*3/uL (ref 1.7–7.7)
Neutrophils Relative %: 52 %
Platelets: 498 10*3/uL — ABNORMAL HIGH (ref 150–400)
RBC: 3.92 MIL/uL (ref 3.87–5.11)
RDW: 19.3 % — ABNORMAL HIGH (ref 11.5–15.5)
WBC: 6.6 10*3/uL (ref 4.0–10.5)
nRBC: 0 % (ref 0.0–0.2)

## 2021-10-11 LAB — COMPREHENSIVE METABOLIC PANEL
ALT: 12 U/L (ref 0–44)
AST: 16 U/L (ref 15–41)
Albumin: 4.2 g/dL (ref 3.5–5.0)
Alkaline Phosphatase: 50 U/L (ref 38–126)
Anion gap: 8 (ref 5–15)
BUN: 8 mg/dL (ref 6–20)
CO2: 28 mmol/L (ref 22–32)
Calcium: 9.7 mg/dL (ref 8.9–10.3)
Chloride: 101 mmol/L (ref 98–111)
Creatinine, Ser: 0.96 mg/dL (ref 0.44–1.00)
GFR, Estimated: >60 mL/min (ref >60)
Glucose, Bld: 79 mg/dL (ref 70–99)
Potassium: 3.5 mmol/L (ref 3.5–5.1)
Sodium: 137 mmol/L (ref 135–145)
Total Bilirubin: 0.6 mg/dL (ref 0.3–1.2)
Total Protein: 8 g/dL (ref 6.5–8.1)

## 2021-10-11 LAB — VITAMIN B12: Vitamin B-12: 464 pg/mL (ref 180–914)

## 2021-10-11 LAB — VITAMIN D 25 HYDROXY (VIT D DEFICIENCY, FRACTURES): Vit D, 25-Hydroxy: 11.41 ng/mL — ABNORMAL LOW (ref 30–100)

## 2021-10-11 MED ORDER — FERROUS SULFATE 325 (65 FE) MG PO TABS
325.0000 mg | ORAL_TABLET | Freq: Every day | ORAL | Status: DC
  Administered 2021-10-11 – 2021-10-14 (×4): 325 mg via ORAL
  Filled 2021-10-11 (×5): qty 1

## 2021-10-11 MED ORDER — TRAZODONE HCL 50 MG PO TABS
50.0000 mg | ORAL_TABLET | Freq: Once | ORAL | Status: AC | PRN
  Administered 2021-10-11: 50 mg via ORAL
  Filled 2021-10-11: qty 1

## 2021-10-11 MED ORDER — DOCUSATE SODIUM 100 MG PO CAPS: 100.0000 mg | ORAL_CAPSULE | Freq: Two times a day (BID) | ORAL | Status: DC | PRN

## 2021-10-11 NOTE — [facility] Suicide Risk Assessment (Signed)
[facility] INPATIENT:  Family/Significant Other Suicide Prevention Education  Suicide Prevention Education:  Contact Attempts: mother Khalidah Herbold [phone] , (name of family member/significant other) has been identified by the patient as the family member/significant other with whom the patient will be residing, and identified as the person(s) who will aid the patient in the event of a mental health crisis.  With written consent from the patient, two attempts were made to provide suicide prevention education, prior to and/or following the patient's discharge.  We were unsuccessful in providing suicide prevention education.  A suicide education pamphlet was given to the patient to share with family/significant other.  Date and time of first attempt:  10/11/2021  /  2:30pm  HIPAA-compliant voicemail left requesting call back Date and time of second attempt:  CSW team to continue efforts \ Lynnell Chad 10/11/2021, 2:32 PM

## 2021-10-11 NOTE — [facility] Group Notes (Signed)
Adult Psychoeducational Group Not Date:  10/11/2021 Time:  0900-1045 Group Topic/Focus: PROGRESSIVE RELAXATION. A group where deep breathing is taught and tensing and relaxation muscle groups is used. Imagery is used as well.  Pts are asked to imagine 3 pillars that hold them up when they are not able to hold themselves up.  Participation Level:  Active  Participation Quality:  Appropriate  Affect:  Appropriate  Cognitive:  Oriented  Insight: Improving  Engagement in Group:  Engaged  Modes of Intervention:  Activity, Discussion, Education, and Support  Additional Comments:  Rates her energy at a 10/10 States what holds her up and supports her is God, faith and family.  Dione Housekeeper

## 2021-10-11 NOTE — [facility] Group Notes (Signed)
Patient did not attend group this morning. Patient was invited to attend.

## 2021-10-11 NOTE — [facility] Group Notes (Signed)
Psychoeducational Group Note  Date: 10-11-21 Time:  1300  Group Topic/Focus:  Making Healthy Choices:   The focus of this group is to help patients identify negative/unhealthy choices they were using prior to admission and identify positive/healthier coping strategies to replace them upon discharge.In this group, patients started asking about the brain and how the brain works with and how the chemicals work for those who use substances, the pros and cons of saboxone.  Participation Level:  Active  Participation Quality:  Appropriate  Affect:  Appropriate  Cognitive:  Oriented  Insight:  Improving  Engagement in Group:  Engaged  Additional Comments:Marland KitchenMarland KitchenMarland Kitchenpt rates her energy level as a 2/10. Shared that she was happy she is in the hospital learning new coping skills. Interacting well with her peers    Dione Housekeeper

## 2021-10-11 NOTE — Progress Notes (Signed)
   10/11/21 2334  Psych Admission Type (Psych Patients Only)  Admission Status Involuntary  Psychosocial Assessment  Patient Complaints None  Eye Contact Fair  Facial Expression Flat (brightens with conversation)  Affect Appropriate to circumstance  Speech Logical/coherent  Interaction Assertive  Motor Activity Other (Comment) (WDL)  Appearance/Hygiene In scrubs  Behavior Characteristics Appropriate to situation  Mood Pleasant  Thought Process  Coherency WDL  Content WDL  Delusions None reported or observed  Perception WDL  Hallucination None reported or observed  Judgment Poor  Confusion None  Danger to Self  Current suicidal ideation? Denies  Danger to Others  Danger to Others None reported or observed

## 2021-10-11 NOTE — Progress Notes (Addendum)
   10/11/21 1100  Psych Admission Type (Psych Patients Only)  Admission Status Involuntary  Psychosocial Assessment  Patient Complaints None  Eye Contact Brief  Facial Expression Flat  Affect Appropriate to circumstance  Speech Logical/coherent  Interaction Minimal  Motor Activity Other (Comment) (WDL)  Appearance/Hygiene In scrubs  Behavior Characteristics Cooperative;Appropriate to situation  Mood Pleasant  Thought Process  Coherency WDL  Content WDL  Delusions None reported or observed  Perception WDL  Hallucination None reported or observed  Judgment Poor  Confusion None  Danger to Self  Current suicidal ideation? Denies  Danger to Others  Danger to Others None reported or observed    D. Pt presented a little brighter today, although she complained of poor sleep, stating, "I was up a lot of the night with racing thoughts". Per pt's self inventory, pt rated her depression, hopelessness and anxiety all 0's today. Pt reported that her goal today was "getting some type of sleep- rest- and preparing for my discharge". Pt has been visible in the milieu and observed attending group this am.  Pt currently denies SI/HI and AVH A. Labs and vitals monitored. Pt given and educated on medications. Pt supported emotionally and encouraged to express concerns and ask questions.   R. Pt remains safe with 15 minute checks. Will continue POC.
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
## 2021-10-11 NOTE — Progress Notes (Addendum)
[facility] MD Progress Note  10/11/2021 4:24 PM Ashley Wilkins  MRN:  [id number] Subjective:  " I am glad I came here and I am glad you changed my medication" Reason for admission:  She is a student from [employer] brought to the [facility] ER after she OD on 30 tablets of Lamictal 100 mg.  She reported that the intent was to ill herself.  Her stressors were recent diagnosis of Bipolar disorder and getting behind with her school work.  She reported that since her outpatient Psychiatrist put her on Seroquel she has been sleeping a lot and not able to concentrate and do her school work.   Objective Note:  Report received, records reviewed and plan of care reviewed with members of our interdisciplinary team.  Patient was seen in the office this evening and she presented a bright affect and was smiling after coming back from the basket court with peers.  Patient denied feeling suicidal and regret the suicide attempt with Lamictal.  Patient also believe Quetiapine helped to make her depressed, angry and anxious.  Taking Quetiapine morning and night,  she could not stay awake to do school work.  She plans to change provider as soon as possible.  She is happy with Abilify and Topamax.  She is participating in all group activities.  She slept 6 hours last night and eats very well.  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  She is for possible discharge in am to continue school.  Again patient is happy with her new medications and care.  We discussed the need to abstain from using Cannabis and take her prescribed medications.  Parents are supportive of her.  CBC results noted with abnormal values but the rest of results are normal.  Patient is instructed to follow up with her PCP for further investigation. Principal Problem: <principal problem not specified> Diagnosis: Active Problems:   Bipolar affective disorder (HCC)  Total Time spent with patient: 30  minutes  Past Psychiatric History: see H&P note  Past Medical History:  Past Medical History:  Diagnosis Date   Anemia    History reviewed. No pertinent surgical history. Family History: History reviewed. No pertinent family history. Family Psychiatric  History: see H&P Note Social History:  Social History   Substance and Sexual Activity  Alcohol Use Not Currently     Social History   Substance and Sexual Activity  Drug Use Never    Social History   Socioeconomic History   Marital status: Single    Spouse name: Not on file   Number of children: Not on file   Years of education: Not on file   Highest education level: Not on file  Occupational History   Not on file  Tobacco Use   Smoking status: Never   Smokeless tobacco: Never  Substance and Sexual Activity   Alcohol use: Not Currently   Drug use: Never   Sexual activity: Not on file  Other Topics Concern   Not on file  Social History Narrative   Not on file   Social Determinants of Health   Financial Resource Strain: Not on file  Food Insecurity: Not on file  Transportation Needs: Not on file  Physical Activity: Not on file  Stress: Not on file  Social Connections: Not on file   Additional Social History:  Sleep: Good  Appetite:  Good  Current Medications: Current Facility-Administered Medications  Medication Dose Route Frequency Provider Last Rate Last Admin   acetaminophen (TYLENOL) tablet 650 mg  650 mg Oral Q4H PRN Jackelyn Poling, NP       alum & mag hydroxide-simeth (MAALOX/MYLANTA) 200-200-20 MG/5ML suspension 30 mL  30 mL Oral Q6H PRN Jackelyn Poling, NP       ARIPiprazole (ABILIFY) tablet 5 mg  5 mg Oral QHS Dahlia Byes C, NP   5 mg at 10/10/21 2111   docusate sodium (COLACE) capsule 100 mg  100 mg Oral BID PRN Roselle Locus, MD       ferrous sulfate tablet 325 mg  325 mg Oral Q breakfast Hill, Shelbie Hutching, MD   325 mg at 10/11/21 0823    ondansetron (ZOFRAN) tablet 4 mg  4 mg Oral Q8H PRN Jackelyn Poling, NP       topiramate (TOPAMAX) tablet 25 mg  25 mg Oral BID Dahlia Byes C, NP   25 mg at 10/11/21 8466    Lab Results:  Results for orders placed or performed during the hospital encounter of 10/09/21 (from the past 48 hour(s))  Lipid panel     Status: Abnormal   Collection Time: 10/10/21  6:39 AM  Result Value Ref Range   Cholesterol 175 0 - 200 mg/dL   Triglycerides 51 <599 mg/dL   HDL 26 (L) >35 mg/dL   Total CHOL/HDL Ratio 6.7 RATIO   VLDL 10 0 - 40 mg/dL   LDL Cholesterol 701 (H) 0 - 99 mg/dL    Comment:        Total Cholesterol/HDL:CHD Risk Coronary Heart Disease Risk Table                     Men   Women  1/2 Average Risk   3.4   3.3  Average Risk       5.0   4.4  2 X Average Risk   9.6   7.1  3 X Average Risk  23.4   11.0        Use the calculated Patient Ratio above and the CHD Risk Table to determine the patient's CHD Risk.        ATP III CLASSIFICATION (LDL):  <100     mg/dL   Optimal  779-390  mg/dL   Near or Above                    Optimal  130-159  mg/dL   Borderline  300-923  mg/dL   High  >300     mg/dL   Very High Performed at [facility], [address] [place], [place] [address]   Hemoglobin A1c     Status: None   Collection Time: 10/10/21  6:39 AM  Result Value Ref Range   Hgb A1c MFr Bld 5.4 4.8 - 5.6 %    Comment: (NOTE) Pre diabetes:          5.7%-6.4%  Diabetes:              >6.4%  Glycemic control for   <7.0% adults with diabetes    Mean Plasma Glucose 108.28 mg/dL    Comment: Performed at [facility], [address] [place], [place] [address]  TSH     Status: None   Collection Time: 10/10/21  6:39 AM  Result Value Ref Range   TSH 1.677  0.350 - 4.500 uIU/mL    Comment: Performed by a 3rd Generation assay with a functional sensitivity of <=0.01 uIU/mL. Performed at [facility], [address], [place], [place]  [address]   CBC with Differential/Platelet     Status: Abnormal   Collection Time: 10/11/21  6:31 AM  Result Value Ref Range   WBC 6.6 4.0 - 10.5 K/uL   RBC 3.92 3.87 - 5.11 MIL/uL   Hemoglobin 8.0 (L) 12.0 - 15.0 g/dL    Comment: Reticulocyte Hemoglobin testing may be clinically indicated, consider ordering this additional test [id number]    HCT 27.4 (L) 36.0 - 46.0 %   MCV 69.9 (L) 80.0 - 100.0 fL   MCH 20.4 (L) 26.0 - 34.0 pg   MCHC 29.2 (L) 30.0 - 36.0 g/dL   RDW 98.3 (H) 38.2 - 50.5 %   Platelets 498 (H) 150 - 400 K/uL   nRBC 0.0 0.0 - 0.2 %   Neutrophils Relative % 52 %   Neutro Abs 3.4 1.7 - 7.7 K/uL   Lymphocytes Relative 38 %   Lymphs Abs 2.5 0.7 - 4.0 K/uL   Monocytes Relative 8 %   Monocytes Absolute 0.5 0.1 - 1.0 K/uL   Eosinophils Relative 1 %   Eosinophils Absolute 0.1 0.0 - 0.5 K/uL   Basophils Relative 1 %   Basophils Absolute 0.0 0.0 - 0.1 K/uL   Immature Granulocytes 0 %   Abs Immature Granulocytes 0.01 0.00 - 0.07 K/uL   Polychromasia PRESENT    Ovalocytes PRESENT     Comment: Performed at [facility], [address] [place], [place] [address]  Comprehensive metabolic panel     Status: None   Collection Time: 10/11/21  6:31 AM  Result Value Ref Range   Sodium 137 135 - 145 mmol/L   Potassium 3.5 3.5 - 5.1 mmol/L   Chloride 101 98 - 111 mmol/L   CO2 28 22 - 32 mmol/L   Glucose, Bld 79 70 - 99 mg/dL    Comment: Glucose reference range applies only to samples taken after fasting for at least 8 hours.   BUN 8 6 - 20 mg/dL   Creatinine, Ser 3.41 0.44 - 1.00 mg/dL   Calcium 9.7 8.9 - 93.7 mg/dL   Total Protein 8.0 6.5 - 8.1 g/dL   Albumin 4.2 3.5 - 5.0 g/dL   AST 16 15 - 41 U/L   ALT 12 0 - 44 U/L   Alkaline Phosphatase 50 38 - 126 U/L   Total Bilirubin 0.6 0.3 - 1.2 mg/dL   GFR, Estimated >90 >24 mL/min    Comment: (NOTE) Calculated using the CKD-EPI Creatinine Equation (2021)    Anion gap 8 5 - 15    Comment: Performed at [facility], [address], [place], [place] [address]  Vitamin B12     Status: None   Collection Time: 10/11/21  6:31 AM  Result Value Ref Range   Vitamin B-12 464 180 - 914 pg/mL    Comment: (NOTE) This assay is not validated for testing neonatal or myeloproliferative syndrome specimens for Vitamin B12 levels. Performed at [facility], [address] [place], [place] [address]   VITAMIN D 25 Hydroxy (Vit-D Deficiency, Fractures)     Status: Abnormal   Collection Time: 10/11/21  6:31 AM  Result Value Ref Range   Vit D, 25-Hydroxy 11.41 (L) 30 - 100 ng/mL    Comment: (NOTE) Vitamin D deficiency  has been defined by the Institute of Medicine  and an Endocrine Society practice guideline as a level of serum 25-OH  vitamin D less than 20 ng/mL (1,2). The Endocrine Society went on to  further define vitamin D insufficiency as a level between 21 and 29  ng/mL (2).  1. IOM (Institute of Medicine). 2010. Dietary reference intakes for  calcium and D. [place]: [employer]. 2. Holick MF, Binkley [place], Bischoff-Ferrari HA, et al. Evaluation,  treatment, and prevention of vitamin D deficiency: an Endocrine  Society clinical practice guideline, JCEM. 2011 Jul; 96(7): 1911-30.  Performed at [facility], [address], [place], [place] [address]     Blood Alcohol level:  Lab Results  Component Value Date   ETH <10 10/08/2021    Metabolic Disorder Labs: Lab Results  Component Value Date   HGBA1C 5.4 10/10/2021   MPG 108.28 10/10/2021   No results found for: PROLACTIN Lab Results  Component Value Date   CHOL 175 10/10/2021   TRIG 51 10/10/2021   HDL 26 (L) 10/10/2021   CHOLHDL 6.7 10/10/2021   VLDL 10 10/10/2021   LDLCALC 139 (H) 10/10/2021    Physical Findings: AIMS:  , ,  ,  ,    CIWA:    COWS:     Musculoskeletal: Strength & Muscle Tone: within normal limits Gait & Station: normal Patient leans:  Front  Psychiatric Specialty Exam:  Presentation  General Appearance: Appropriate for Environment; Well Groomed; Neat  Eye Contact:Good  Speech:Clear and Coherent; Normal Rate; Slow  Speech Volume:Normal  Handedness:Right   Mood and Affect  Mood:Euthymic  Affect:Non-Congruent   Thought Process  Thought Processes:Coherent; Goal Directed; Linear  Descriptions of Associations:Intact  Orientation:Full (Time, Place and Person)  Thought Content:Logical  History of Schizophrenia/Schizoaffective disorder:No  Duration of Psychotic Symptoms:No data recorded Hallucinations:Hallucinations: None  Ideas of Reference:None  Suicidal Thoughts:Suicidal Thoughts: No  Homicidal Thoughts:Homicidal Thoughts: No   Sensorium  Memory:Immediate Good; Recent Good; Remote Good  Judgment:Good  Insight:Good   Executive Functions  Concentration:Good  Attention Span:Good  Recall:Good  Fund of Knowledge:Good  Language:Good   Psychomotor Activity  Psychomotor Activity:Psychomotor Activity: Normal   Assets  Assets:Communication Skills; Desire for Improvement; Financial Resources/Insurance; Housing; Physical Health; Resilience   Sleep  Sleep:Sleep: Good Number of Hours of Sleep: 6    Physical Exam: Physical Exam Vitals and nursing note reviewed.  Constitutional:      Appearance: Normal appearance.  HENT:     Head: Normocephalic and atraumatic.     Nose: Nose normal.  Cardiovascular:     Rate and Rhythm: Normal rate and regular rhythm.  Pulmonary:     Effort: Pulmonary effort is normal.  Musculoskeletal:        General: Normal range of motion.     Cervical back: Normal range of motion.  Skin:    General: Skin is warm and dry.  Neurological:     General: No focal deficit present.     Mental Status: She is alert and oriented to person, place, and time.   Review of Systems  Constitutional: Negative.   HENT: Negative.    Eyes: Negative.   Respiratory:  Negative.    Cardiovascular: Negative.   Gastrointestinal: Negative.   Genitourinary: Negative.   Musculoskeletal: Negative.   Skin: Negative.   Neurological: Negative.   Endo/Heme/Allergies: Negative.   Psychiatric/Behavioral:  Positive for depression.   Blood pressure (!) 126/91, pulse 100, temperature 98.8 F (37.1 C), temperature source Oral,  resp. rate 20, height  (1.727 m), weight 93 kg, SpO2 95 %. Body mass index is 31.17 kg/m.   Treatment Plan Summary: Daily contact with patient to assess and evaluate symptoms and progress in treatment and Medication management Continue Aripiprazole 5 mg po at bed time-MOOD/ Mental health Topiramate 25 mg po bid for mood Offer PRN Medications per protocol.             Encourage group and therapy participation.    Earney Navy, NP-PMHNP-BC 10/11/2021, 4:24 PM

## 2021-10-12 ENCOUNTER — Encounter (HOSPITAL_COMMUNITY): Payer: Self-pay

## 2021-10-12 LAB — FERRITIN: Ferritin: 5 ng/mL — ABNORMAL LOW (ref 11–307)

## 2021-10-12 LAB — IRON AND TIBC
Iron: 22 ug/dL — ABNORMAL LOW (ref 28–170)
Saturation Ratios: 5 % — ABNORMAL LOW (ref 10.4–31.8)
TIBC: 448 ug/dL (ref 250–450)
UIBC: 426 ug/dL

## 2021-10-12 MED ORDER — ADULT MULTIVITAMIN W/MINERALS CH
1.0000 | ORAL_TABLET | Freq: Every day | ORAL | Status: DC
  Administered 2021-10-12 – 2021-10-14 (×3): 1 via ORAL
  Filled 2021-10-12 (×4): qty 1

## 2021-10-12 MED ORDER — TRAZODONE HCL 50 MG PO TABS
50.0000 mg | ORAL_TABLET | Freq: Every evening | ORAL | Status: AC | PRN
  Administered 2021-10-12: 50 mg via ORAL
  Filled 2021-10-12: qty 1

## 2021-10-12 MED ORDER — VITAMIN D3 25 MCG PO TABS
1000.0000 [IU] | ORAL_TABLET | Freq: Every day | ORAL | Status: DC
  Filled 2021-10-12 (×2): qty 1

## 2021-10-12 MED ORDER — VITAMIN D3 25 MCG PO TABS
2000.0000 [IU] | ORAL_TABLET | Freq: Every day | ORAL | Status: DC
  Administered 2021-10-13: 2000 [IU] via ORAL
  Filled 2021-10-12 (×3): qty 2

## 2021-10-12 NOTE — B[facility] Group Notes (Signed)
Patient was given material for group today 

## 2021-10-12 NOTE — Group Note (Signed)
Recreation Therapy Group Note   Group Topic:Stress Management  Group Date: 10/12/2021 Start Time: 0935 End Time: 1050 Facilitators: Caroll Rancher, LRT/CTRS Location: [address] Dayroom  Goal Area(s) Addresses:  Patient will identify positive stress management techniques. Patient will identify benefits of using stress management post d/c.  Group Description:  Meditation.  LRT played a meditation that focused on having patience when dealing with situations beyond our control.  Patients were to sit and focus as the meditation played to fully engage.   Affect/Mood: N/A   Participation Level: Did not attend     Clinical Observations/Individualized Feedback: Pt did not attend group.     Plan: Continue to engage patient in RT group sessions 2-3x/week.   Caroll Rancher, LRT/CTRS 10/12/2021 1:38 PM

## 2021-10-12 NOTE — Progress Notes (Signed)
Spoke with pt's mother, Ryley Teater, on the phone. Mother expressed concern that pt has not been discharged today, seeing that pt had the impression from staff the previous day that she would be discharging Monday. Mother reported that pt had missed the 'ADHD testing' at her college this am. Mother reported that the testing was important, as it would determine if pt would qualify for accommodations. Mother stated that pt is under the care of a therapist at her college , and did not see why she couldn't be discharged if she was no longer suicidal. Mother stated she didn't want her daughter to get behind in her schoolwork.  Ms Welke would like to speak with the Provider if possible.  Contact number is:  Ashauna Bertholf-   [phone]

## 2021-10-12 NOTE — [facility] Group Notes (Signed)
Patient did not attend group.    Spiritual care group on grief and loss facilitated by chaplain Katy Athol Bolds, BCC   Group Goal:   Support / Education around grief and loss   Members engage in facilitated group support and psycho-social education.   Group Description:   Following introductions and group rules, group members engaged in facilitated group dialog and support around topic of loss, with particular support around experiences of loss in their lives. Group Identified types of loss (relationships / self / things) and identified patterns, circumstances, and changes that precipitate losses. Reflected on thoughts / feelings around loss, normalized grief responses, and recognized variety in grief experience. Group noted Worden's four tasks of grief in discussion.   Group drew on Adlerian / Rogerian, narrative, MI,    

## 2021-10-12 NOTE — Progress Notes (Addendum)
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  Chart Review, 24 hr Events: The patient's chart was reviewed and nursing notes were reviewed. The patient's case was discussed in multidisciplinary team meeting.  Per MAR: - Patient is  compliant with scheduled meds. - PRNs: trazodone Per RN notes, no documented behavioral issues and is attending group. Patient slept, 6.5 hours  Patient had the following psychiatric recommendations yesterday: -Continue Aripiprazole 5 mg po at bed time-MOOD/ Mental health -Topiramate 25 mg po bid for mood -Encourage group and therapy participation.    TODAY'S INTERVIEW Patient seen and assessed with attending Dr. Loleta Chance.  Patient states that she is doing better today.  Patient states that her medications has been appropriate and she feels that her mental health progressing.  Patient states that she is sleeping appropriately, hydrating appropriately, eating appropriately.  Patient does mention that she has been eating less food than she normally does but is eating 3 meals a day.  Patient expressed concern that she has not been discharged today because she had an ADHD testing at her college to assess for accommodations.  Discussed with patient's the importance of addressing her stressors and obtaining coping skills while at the hospital given the severity of her suicide attempt.  Discussed with patient that her mother is requesting additional information regarding patient's discharge.  Patient presently requesting that we do not discuss patient's situation until closer to discharge.   Principal Problem: Severe bipolar I disorder, current or most recent episode depressed (HCC) Diagnosis: Principal Problem:   Severe bipolar I disorder, current or most recent episode depressed [facility]) Active Problems:    Bipolar affective disorder (HCC)  Total Time spent with patient:  I personally spent 25 minutes on the unit in direct patient care. The direct patient care time included face-to-face time with the patient, reviewing the patient's chart, communicating with other professionals, and coordinating care. Greater than 50% of this time was spent in counseling or coordinating care with the patient regarding goals of hospitalization, psycho-education, and discharge planning needs.   Past Psychiatric History: see H&P  Past Medical History:  Past Medical History:  Diagnosis Date   Anemia    History reviewed. No pertinent surgical history. Family History: History reviewed. No pertinent family history. Family Psychiatric  History: see H&P Social History:  Social History   Substance and Sexual Activity  Alcohol Use Not Currently     Social History   Substance and Sexual Activity  Drug Use Never    Social History   Socioeconomic History   Marital status: Single    Spouse name: Not on file   Number of children: Not on file   Years of education: Not on file   Highest education level: Not on file  Occupational History   Not on file  Tobacco Use   Smoking status: Never   Smokeless tobacco: Never  Substance and Sexual Activity   Alcohol use: Not Currently   Drug use: Never   Sexual activity: Not on file  Other Topics Concern   Not on file  Social History Narrative   Not on file   Social Determinants of Health   Financial Resource Strain: Not on file  Food Insecurity: Not on file  Transportation Needs: Not on file  Physical Activity: Not on file  Stress: Not  on file  Social Connections: Not on file   Additional Social History:                         Sleep: Good  Appetite:  Fair  Current Medications: Current Facility-Administered Medications  Medication Dose Route Frequency Provider Last Rate Last Admin   acetaminophen (TYLENOL) tablet 650 mg  650 mg Oral Q4H PRN  Jackelyn Poling, NP       alum & mag hydroxide-simeth (MAALOX/MYLANTA) 200-200-20 MG/5ML suspension 30 mL  30 mL Oral Q6H PRN Nira Conn A, NP       ARIPiprazole (ABILIFY) tablet 5 mg  5 mg Oral QHS Dahlia Byes C, NP   5 mg at 10/11/21 2059   docusate sodium (COLACE) capsule 100 mg  100 mg Oral BID PRN Roselle Locus, MD       ferrous sulfate tablet 325 mg  325 mg Oral Q breakfast Tishie Altmann, Shelbie Hutching, MD   325 mg at 10/12/21 0800   multivitamin with minerals tablet 1 tablet  1 tablet Oral Daily Comer Locket, MD   1 tablet at 10/12/21 1250   ondansetron (ZOFRAN) tablet 4 mg  4 mg Oral Q8H PRN Jackelyn Poling, NP       topiramate (TOPAMAX) tablet 25 mg  25 mg Oral BID Dahlia Byes C, NP   25 mg at 10/12/21 0800    Lab Results:  Results for orders placed or performed during the hospital encounter of 10/09/21 (from the past 48 hour(s))  CBC with Differential/Platelet     Status: Abnormal   Collection Time: 10/11/21  6:31 AM  Result Value Ref Range   WBC 6.6 4.0 - 10.5 K/uL   RBC 3.92 3.87 - 5.11 MIL/uL   Hemoglobin 8.0 (L) 12.0 - 15.0 g/dL    Comment: Reticulocyte Hemoglobin testing may be clinically indicated, consider ordering this additional test [id number]    HCT 27.4 (L) 36.0 - 46.0 %   MCV 69.9 (L) 80.0 - 100.0 fL   MCH 20.4 (L) 26.0 - 34.0 pg   MCHC 29.2 (L) 30.0 - 36.0 g/dL   RDW 47.8 (H) 29.5 - 62.1 %   Platelets 498 (H) 150 - 400 K/uL   nRBC 0.0 0.0 - 0.2 %   Neutrophils Relative % 52 %   Neutro Abs 3.4 1.7 - 7.7 K/uL   Lymphocytes Relative 38 %   Lymphs Abs 2.5 0.7 - 4.0 K/uL   Monocytes Relative 8 %   Monocytes Absolute 0.5 0.1 - 1.0 K/uL   Eosinophils Relative 1 %   Eosinophils Absolute 0.1 0.0 - 0.5 K/uL   Basophils Relative 1 %   Basophils Absolute 0.0 0.0 - 0.1 K/uL   Immature Granulocytes 0 %   Abs Immature Granulocytes 0.01 0.00 - 0.07 K/uL   Polychromasia PRESENT    Ovalocytes PRESENT     Comment: Performed at [facility], [address], [place], [place] [address]  Comprehensive metabolic panel     Status: None   Collection Time: 10/11/21  6:31 AM  Result Value Ref Range   Sodium 137 135 - 145 mmol/L   Potassium 3.5 3.5 - 5.1 mmol/L   Chloride 101 98 - 111 mmol/L   CO2 28 22 - 32 mmol/L   Glucose, Bld 79 70 - 99 mg/dL    Comment: Glucose reference range applies only to samples taken after fasting for at least 8 hours.  BUN 8 6 - 20 mg/dL   Creatinine, Ser 7.65 0.44 - 1.00 mg/dL   Calcium 9.7 8.9 - 46.5 mg/dL   Total Protein 8.0 6.5 - 8.1 g/dL   Albumin 4.2 3.5 - 5.0 g/dL   AST 16 15 - 41 U/L   ALT 12 0 - 44 U/L   Alkaline Phosphatase 50 38 - 126 U/L   Total Bilirubin 0.6 0.3 - 1.2 mg/dL   GFR, Estimated >03 >54 mL/min    Comment: (NOTE) Calculated using the CKD-EPI Creatinine Equation (2021)    Anion gap 8 5 - 15    Comment: Performed at [facility], [address] [place], [place] [address]  Vitamin B12     Status: None   Collection Time: 10/11/21  6:31 AM  Result Value Ref Range   Vitamin B-12 464 180 - 914 pg/mL    Comment: (NOTE) This assay is not validated for testing neonatal or myeloproliferative syndrome specimens for Vitamin B12 levels. Performed at [facility], [address] [place], [place] [address]   VITAMIN D 25 Hydroxy (Vit-D Deficiency, Fractures)     Status: Abnormal   Collection Time: 10/11/21  6:31 AM  Result Value Ref Range   Vit D, 25-Hydroxy 11.41 (L) 30 - 100 ng/mL    Comment: (NOTE) Vitamin D deficiency has been defined by the Institute of Medicine  and an Endocrine Society practice guideline as a level of serum 25-OH  vitamin D less than 20 ng/mL (1,2). The Endocrine Society went on to  further define vitamin D insufficiency as a level between 21 and 29  ng/mL (2).  1. IOM (Institute of Medicine). 2010. Dietary reference intakes for  calcium and D. [place]: [employer]. 2. Holick MF,  Binkley Quintana, Bischoff-Ferrari HA, et al. Evaluation,  treatment, and prevention of vitamin D deficiency: an Endocrine  Society clinical practice guideline, JCEM. 2011 Jul; 96(7): 1911-30.  Performed at [facility], [address], [place], [place] [address]     Blood Alcohol level:  Lab Results  Component Value Date   ETH <10 10/08/2021    Metabolic Disorder Labs: Lab Results  Component Value Date   HGBA1C 5.4 10/10/2021   MPG 108.28 10/10/2021   No results found for: PROLACTIN Lab Results  Component Value Date   CHOL 175 10/10/2021   TRIG 51 10/10/2021   HDL 26 (L) 10/10/2021   CHOLHDL 6.7 10/10/2021   VLDL 10 10/10/2021   LDLCALC 139 (H) 10/10/2021    Physical Findings: AIMS:  , ,  ,  ,    CIWA:    COWS:     Musculoskeletal: Strength & Muscle Tone: within normal limits Gait & Station: normal Patient leans: N/A  Psychiatric Specialty Exam:  Presentation  General Appearance: Appropriate for Environment; Well Groomed  Eye Contact:Good  Speech:Clear and Coherent; Normal Rate  Speech Volume:Normal  Handedness:Right   Mood and Affect  Mood:Euthymic  Affect:Congruent; Appropriate   Thought Process  Thought Processes:Coherent; Goal Directed; Linear  Descriptions of Associations:Intact  Orientation:Full (Time, Place and Person)  Thought Content:Logical  History of Schizophrenia/Schizoaffective disorder:No  Duration of Psychotic Symptoms:No data recorded Hallucinations:Hallucinations: None  Ideas of Reference:None  Suicidal Thoughts:Suicidal Thoughts: No  Homicidal Thoughts:Homicidal Thoughts: No   Sensorium  Memory:Immediate Good; Recent Good; Remote Good  Judgment:Good  Insight:Good   Executive Functions  Concentration:Good  Attention Span:Good  Recall:Good  Fund of Knowledge:Good  Language:Good   Psychomotor Activity  Psychomotor  Activity:Psychomotor Activity: Normal   Assets  Assets:Communication Skills;  Desire for Improvement; Financial Resources/Insurance; Housing; Physical Health; Resilience   Sleep  Sleep:Sleep: Good Number of Hours of Sleep: 6.5    Physical Exam: Physical Exam Vitals and nursing note reviewed.  Constitutional:      Appearance: Normal appearance. She is normal weight.  HENT:     Head: Normocephalic and atraumatic.  Pulmonary:     Effort: Pulmonary effort is normal.  Neurological:     General: No focal deficit present.     Mental Status: She is oriented to person, place, and time.   Review of Systems  Respiratory:  Negative for shortness of breath.   Cardiovascular:  Negative for chest pain.  Gastrointestinal:  Negative for abdominal pain, constipation, diarrhea, heartburn, nausea and vomiting.  Neurological:  Negative for dizziness and headaches.  Blood pressure 128/70, pulse (!) 105, temperature 98.1 F (36.7 C), temperature source Oral, resp. rate 20, height 5\' 8"  (1.727 m), weight 93 kg, SpO2 98 %. Body mass index is 31.17 kg/m.   Treatment Plan Summary: Daily contact with patient to assess and evaluate symptoms and progress in treatment and Medication management  ASSESSMENT Patient is a 20 year old female with history of bipolar disorder presenting to [facility] due to intentional overdose of Lamictal.  Patient appears to be doing better.  Rescinding IVC and having patient signed in voluntarily.  PLAN Psychiatric Problems Bipolar Disorder Abilify 5 mg po Topiramate 25 mg po bid for mood Encourage group therapy participation  Medical Problems Flu shot received 10/22  Vitamin D Deficiency Vit D Level 11.41 Vitamin D 2000 units qd Multivitamin with minerals tablet  Microcytic Anemia Iron, TIBC and Ferritin ordered Ferrous Sulfate 325 mg qd  Elevated LDL Cholesterol LDL Cholesterol 139 -Recommend PCP to follow up  PRNs Tylenol 650 mg for mild pain Maalox/Mylanta 30 mL for indigestion Colace 100 mg bid prn for mild  constipation Hydroxyzine 25 mg tid for anxiety Milk of Magnesia 30 mL for constipation Trazodone 50 mg for sleep  3. Safety and Monitoring: Involuntary admission to inpatient psychiatric unit for safety, stabilization and treatment Daily contact with patient to assess and evaluate symptoms and progress in treatment Patient's case to be discussed in multi-disciplinary team meeting Observation Level : q15 minute checks Vital signs: q12 hours Precautions: suicide, elopement, and assault   4. Discharge Planning: Social work and case management to assist with discharge planning and identification of hospital follow-up needs prior to discharge Estimated LOS: 5-7 days Discharge Concerns: Need to establish a safety plan; Medication compliance and effectiveness Discharge Goals: Return home with outpatient referrals for mental health follow-up including medication management/psychotherapy  11/22, MD 10/12/2021, 1:57 PM

## 2021-10-12 NOTE — Progress Notes (Signed)
NUTRITION ASSESSMENT RD working remotely.   Pt identified as at risk on the Malnutrition Screen Tool  INTERVENTION: - will order 1 tablet multivitamin with minerals/day. [facility] staff to continue to encourage PO intakes of meals and snacks.    NUTRITION DIAGNOSIS: Unintentional weight loss related to sub-optimal intake as evidenced by pt report.   Goal: Pt to meet >/= 90% of their estimated nutrition needs.  Monitor:  PO intake  Assessment:  Patient was taken to the ED after OD on 30 tablets of 100 mg lamictal with intent to commit suicide. Notes indicate patient reported stressors of recent dx of bipolar disorder and being behind on school work. She reported to ED staff that she has been sleeping more than usual and unable to concentrate.   Weight on 10/21 was 205 lb, which appears to be a stated weight. Weight on 04/13/21 was 219 lb. This indicates 14 lb weight loss (6.4% body weight) in the past 6 months; not significant for time frame.   20 y.o. female  Height: Ht Readings from Last 1 Encounters:  10/09/21 5\' 8"  (1.727 m) (93 %, Z= 1.45)*   * Growth percentiles are based on CDC (Girls, 2-20 Years) data.    Weight: Wt Readings from Last 1 Encounters:  10/09/21 93 kg (98 %, Z= 2.01)*   * Growth percentiles are based on CDC (Girls, 2-20 Years) data.    Weight Hx: Wt Readings from Last 10 Encounters:  10/09/21 93 kg (98 %, Z= 2.01)*  10/08/21 101.6 kg (99 %, Z= 2.26)*  04/13/21 99.8 kg (99 %, Z= 2.21)*  04/12/21 99.8 kg (99 %, Z= 2.21)*  11/04/20 113 kg (>99 %, Z= 2.47)*   * Growth percentiles are based on CDC (Girls, 2-20 Years) data.    BMI:  Body mass index is 31.17 kg/m. Pt meets criteria for obesity based on current BMI.  Estimated Nutritional Needs: Kcal: 25-30 kcal/kg Protein: > 1 gram protein/kg Fluid: 1 ml/kcal  Diet Order:  Diet Order             Diet regular Room service appropriate? Yes; Fluid consistency: Thin  Diet effective now                   Pt is also offered choice of unit snacks mid-morning and mid-afternoon.  Pt is eating as desired.   Lab results and medications reviewed.      11/06/20, MS, RD, LDN, CNSC Inpatient Clinical Dietitian RD pager # available in [facility]  After hours/weekend pager # available in [facility]

## 2021-10-12 NOTE — BH IP Treatment Plan (Signed)
Interdisciplinary Treatment and Diagnostic Plan Update  10/12/2021 Time of Session: 2:05pm Ashley Wilkins MRN: [id number]  Principal Diagnosis: Severe bipolar I disorder, current or most recent episode depressed (HCC)  Secondary Diagnoses: Principal Problem:   Severe bipolar I disorder, current or most recent episode depressed (HCC) Active Problems:   Bipolar affective disorder (HCC)   Current Medications:  Current Facility-Administered Medications  Medication Dose Route Frequency Provider Last Rate Last Admin   acetaminophen (TYLENOL) tablet 650 mg  650 mg Oral Q4H PRN Jackelyn Poling, NP       alum & mag hydroxide-simeth (MAALOX/MYLANTA) 200-200-20 MG/5ML suspension 30 mL  30 mL Oral Q6H PRN Jackelyn Poling, NP       ARIPiprazole (ABILIFY) tablet 5 mg  5 mg Oral QHS Dahlia Byes C, NP   5 mg at 10/11/21 2059   docusate sodium (COLACE) capsule 100 mg  100 mg Oral BID PRN Roselle Locus, MD       ferrous sulfate tablet 325 mg  325 mg Oral Q breakfast Hill, Shelbie Hutching, MD   325 mg at 10/12/21 0800   multivitamin with minerals tablet 1 tablet  1 tablet Oral Daily Comer Locket, MD   1 tablet at 10/12/21 1250   ondansetron (ZOFRAN) tablet 4 mg  4 mg Oral Q8H PRN Jackelyn Poling, NP       topiramate (TOPAMAX) tablet 25 mg  25 mg Oral BID Onuoha, Josephine C, NP   25 mg at 10/12/21 0800   traZODone (DESYREL) tablet 50 mg  50 mg Oral QHS PRN Park Pope, MD       Vitamin D3 (Vitamin D) tablet 1,000 Units  1,000 Units Oral Daily Park Pope, MD       PTA Medications: Medications Prior to Admission  Medication Sig Dispense Refill Last Dose   lamoTRIgine (LAMICTAL) 100 MG tablet Take 100 mg by mouth daily. 1 tablet by mouth daily for 14 days, then increase to 1 and 1/2 tablet(s) daily      QUEtiapine (SEROQUEL) 50 MG tablet Take 50 mg by mouth at bedtime as needed (sleep).       Patient Stressors: Educational concerns   Other: Social Media, and recent dx of bipolar d/o     Patient Strengths: Motivation for treatment/growth  Supportive family/friends   Treatment Modalities: Medication Management, Group therapy, Case management,  1 to 1 session with clinician, Psychoeducation, Recreational therapy.   Physician Treatment Plan for Primary Diagnosis: Severe bipolar I disorder, current or most recent episode depressed (HCC) Long Term Goal(s): Improvement in symptoms so as ready for discharge   Short Term Goals: Ability to identify changes in lifestyle to reduce recurrence of condition will improve Ability to verbalize feelings will improve Ability to disclose and discuss suicidal ideas Ability to demonstrate self-control will improve Ability to identify and develop effective coping behaviors will improve Ability to maintain clinical measurements within normal limits will improve Compliance with prescribed medications will improve Ability to identify triggers associated with substance abuse/mental health issues will improve  Medication Management: Evaluate patient's response, side effects, and tolerance of medication regimen.  Therapeutic Interventions: 1 to 1 sessions, Unit Group sessions and Medication administration.  Evaluation of Outcomes: Progressing  Physician Treatment Plan for Secondary Diagnosis: Principal Problem:   Severe bipolar I disorder, current or most recent episode depressed (HCC) Active Problems:   Bipolar affective disorder (HCC)  Long Term Goal(s): Improvement in symptoms so as ready for discharge   Short Term Goals: Ability  to identify changes in lifestyle to reduce recurrence of condition will improve Ability to verbalize feelings will improve Ability to disclose and discuss suicidal ideas Ability to demonstrate self-control will improve Ability to identify and develop effective coping behaviors will improve Ability to maintain clinical measurements within normal limits will improve Compliance with prescribed medications will  improve Ability to identify triggers associated with substance abuse/mental health issues will improve     Medication Management: Evaluate patient's response, side effects, and tolerance of medication regimen.  Therapeutic Interventions: 1 to 1 sessions, Unit Group sessions and Medication administration.  Evaluation of Outcomes: Progressing   RN Treatment Plan for Primary Diagnosis: Severe bipolar I disorder, current or most recent episode depressed (HCC) Long Term Goal(s): Knowledge of disease and therapeutic regimen to maintain health will improve  Short Term Goals: Ability to remain free from injury will improve, Ability to verbalize frustration and anger appropriately will improve, Ability to demonstrate self-control, Ability to identify and develop effective coping behaviors will improve, and Compliance with prescribed medications will improve  Medication Management: RN will administer medications as ordered by provider, will assess and evaluate patient's response and provide education to patient for prescribed medication. RN will report any adverse and/or side effects to prescribing provider.  Therapeutic Interventions: 1 on 1 counseling sessions, Psychoeducation, Medication administration, Evaluate responses to treatment, Monitor vital signs and CBGs as ordered, Perform/monitor CIWA, COWS, AIMS and Fall Risk screenings as ordered, Perform wound care treatments as ordered.  Evaluation of Outcomes: Progressing   LCSW Treatment Plan for Primary Diagnosis: Severe bipolar I disorder, current or most recent episode depressed (HCC) Long Term Goal(s): Safe transition to appropriate next level of care at discharge, Engage patient in therapeutic group addressing interpersonal concerns.  Short Term Goals: Engage patient in aftercare planning with referrals and resources, Increase social support, Increase ability to appropriately verbalize feelings, Increase emotional regulation, Identify  triggers associated with mental health/substance abuse issues, and Increase skills for wellness and recovery  Therapeutic Interventions: Assess for all discharge needs, 1 to 1 time with Social worker, Explore available resources and support systems, Assess for adequacy in community support network, Educate family and significant other(s) on suicide prevention, Complete Psychosocial Assessment, Interpersonal group therapy.  Evaluation of Outcomes: Progressing   Progress in Treatment: Attending groups: Yes. Participating in groups: Yes. Taking medication as prescribed: Yes. Toleration medication: Yes. Family/Significant other contact made: No, will contact:  have attempted to reach mother Patient understands diagnosis: Yes. Discussing patient identified problems/goals with staff: Yes. Medical problems stabilized or resolved: Yes. Denies suicidal/homicidal ideation: Yes. Issues/concerns per patient self-inventory: No.   New problem(s) identified: No, Describe:  none  New Short Term/Long Term Goal(s): medication stabilization, elimination of SI thoughts, development of comprehensive mental wellness plan.    Patient Goals:  "To get stable on medicine"  Discharge Plan or Barriers: Patient is to return home and is to follow up with [facility] for therapy and medication management  Reason for Continuation of Hospitalization: Anxiety Mania Medication stabilization  Estimated Length of Stay: 1-3 days   Scribe for Treatment Team: Otelia Santee, LCSW 10/12/2021 2:50 PM

## 2021-10-12 NOTE — Progress Notes (Signed)
The patient's positive event for the day is that she met new people from the other hallways. Her goal for tomorrow is to meet more people and to attend all of the groups.

## 2021-10-12 NOTE — Progress Notes (Addendum)
   10/12/21 1100  Psych Admission Type (Psych Patients Only)  Admission Status Involuntary  Psychosocial Assessment  Patient Complaints None  Eye Contact Fair  Facial Expression Flat (brightens with conversation)  Affect Appropriate to circumstance  Speech Logical/coherent  Interaction Assertive  Motor Activity Other (Comment) (WDL)  Appearance/Hygiene In scrubs  Behavior Characteristics Cooperative;Appropriate to situation  Mood Pleasant  Thought Process  Coherency WDL  Content WDL  Delusions None reported or observed  Perception WDL  Hallucination None reported or observed  Judgment Poor  Confusion None  Danger to Self  Current suicidal ideation? Denies  Danger to Others  Danger to Others None reported or observed   D. Pt presents as friendly- smiles upon approach- reported that she slept much better last night. Pt rated her depression, hopelessness and anxiety a 0/0/2, respectively. Pt has been visible in the milieu interacting well with peers and staff, and observed attending groups. Pt currently denies SI/HI and AVH and reports that she is tolerating her medications well.  A. Labs and vitals monitored. Pt given and educated on medications. Pt supported emotionally and encouraged to express concerns and ask questions.   R. Pt remains safe with 15 minute checks. Will continue POC.

## 2021-10-12 NOTE — [facility] Suicide Risk Assessment (Signed)
[facility] INPATIENT:  Family/Significant Other Suicide Prevention Education  Suicide Prevention Education:  Contact Attempts: Ashley Wilkins [phone] (Mother) has been identified by the patient as the family member/significant other with whom the patient will be residing, and identified as the person(s) who will aid the patient in the event of a mental health crisis.  With written consent from the patient, two attempts were made to provide suicide prevention education, prior to and/or following the patient's discharge.  We were unsuccessful in providing suicide prevention education.  A suicide education pamphlet was given to the patient to share with family/significant other.  Date and time of first attempt: 10/11/2021 at 2:30pm Date and time of second attempt: 10/12/2021 at 2:58pm  [name] 10/12/2021, 3:57 PM

## 2021-10-12 NOTE — Group Note (Signed)
LCSW Group Therapy Note  Group Date: 10/12/2021 Start Time: 1300 End Time: 1400   Type of Therapy and Topic:  Group Therapy: Anger Cues and Responses  Participation Level:  Active   Description of Group:   In this group, patients learned how to recognize the physical, cognitive, emotional, and behavioral responses they have to anger-provoking situations.  They identified a recent time they became angry and how they reacted.  They analyzed how their reaction was possibly beneficial and how it was possibly unhelpful.  The group discussed a variety of healthier coping skills that could help with such a situation in the future.  Focus was placed on how helpful it is to recognize the underlying emotions to our anger, because working on those can lead to a more permanent solution as well as our ability to focus on the important rather than the urgent.  Therapeutic Goals: Patients will remember their last incident of anger and how they felt emotionally and physically, what their thoughts were at the time, and how they behaved. Patients will identify how their behavior at that time worked for them, as well as how it worked against them. Patients will explore possible new behaviors to use in future anger situations. Patients will learn that anger itself is normal and cannot be eliminated, and that healthier reactions can assist with resolving conflict rather than worsening situations.  Summary of Patient Progress:  Shamicka was active during the group. 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 Patient discussed how looking at other perspectives and getting assistance from supervisor assisted with her anger. She demonstrated good  insight into the subject matter, was respectful of peers, and participated throughout the entire session.  Therapeutic Modalities:   Cognitive Behavioral Therapy    Beatris Si, LCSW 10/12/2021  2:02  PM

## 2021-10-13 DIAGNOSIS — F314 Bipolar disorder, current episode depressed, severe, without psychotic features: Principal | ICD-10-CM

## 2021-10-13 MED ORDER — TRAZODONE HCL 50 MG PO TABS
50.0000 mg | ORAL_TABLET | Freq: Every evening | ORAL | Status: DC | PRN
  Administered 2021-10-13: 50 mg via ORAL
  Filled 2021-10-13: qty 1

## 2021-10-13 MED ORDER — VITAMIN D3 25 MCG PO TABS
1000.0000 [IU] | ORAL_TABLET | Freq: Every day | ORAL | Status: DC
  Administered 2021-10-14: 1000 [IU] via ORAL
  Filled 2021-10-13 (×2): qty 1

## 2021-10-13 NOTE — Group Note (Signed)
Group Topic: Goal Setting  Group Date: 10/13/2021 Start Time: 0830 End Time: 0900 Facilitators: Margaret Pyle, NT  Department: [facility] INPATIENT ADULT 400B  Number of Participants: 11  Group Focus: goals/reality orientation  Name: Ashley Wilkins Date of Birth: Jul 22, 2001  MR: [id number]    Level of Participation: Pt attended morning goals group

## 2021-10-13 NOTE — Progress Notes (Signed)
   10/13/21 2200  Psych Admission Type (Psych Patients Only)  Admission Status Involuntary  Psychosocial Assessment  Patient Complaints None  Eye Contact Fair  Facial Expression Animated  Affect Appropriate to circumstance  Speech Logical/coherent  Interaction Assertive  Motor Activity Other (Comment) (WDL)  Appearance/Hygiene In scrubs  Behavior Characteristics Cooperative  Mood Pleasant  Thought Process  Coherency WDL  Content WDL  Delusions None reported or observed  Perception WDL  Hallucination None reported or observed  Judgment WDL  Confusion None  Danger to Self  Current suicidal ideation? Denies  Danger to Others  Danger to Others None reported or observed

## 2021-10-13 NOTE — Progress Notes (Addendum)
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  Chart Review, 24 hr Events: The patient's chart was reviewed and nursing notes were reviewed. The patient's case was discussed in multidisciplinary team meeting.  Per MAR: - Patient is  compliant with scheduled meds. - PRNs: trazodone Per RN notes, no documented behavioral issues and is attending group. Patient slept, 6.75 hours  Patient had the following psychiatric recommendations yesterday: -Continue Aripiprazole 5 mg po at bed time-MOOD/ Mental health -Topiramate 25 mg po bid for mood -Encourage group and therapy participation.    TODAY'S INTERVIEW Patient seen and assessed with attending Dr. Mason Jim.  Patient states that she is doing well today.  Patient states that she has had no physical complaints at this time.  Patient states that her appetite has been somewhat poor but is otherwise doing well.  Patient reports her poor appetite had been occurring prior to hospitalization as well and we discussed that this needs to be monitored while on Topamax.  Patient agreeable with discharge tomorrow.  Patient states that her mood has been stable and feels that her medications are currently appropriate for her.  Discussed with patient that she is not currently on an antidepressant which may benefit her given she is not currently on a medication to manage her depressive symptoms related to her bipolar disorder.  Patient states that she would rather have this managed as an outpatient.  Patient is agreeable for this to be discussed with parents prior to discharge so that they are aware of treatment recommendations for outpatient.   Patient denies present SI/HI/AVH.  Patient denies paranoia, first rank symptoms, delusions, ideas of reference.  Principal Problem: Severe bipolar I  disorder, current or most recent episode depressed (HCC) Diagnosis: Principal Problem:   Severe bipolar I disorder, current or most recent episode depressed (HCC) Active Problems:   Bipolar affective disorder (HCC)   Vitamin D deficiency  Total Time spent with patient:  I personally spent 25 minutes on the unit in direct patient care. The direct patient care time included face-to-face time with the patient, reviewing the patient's chart, communicating with other professionals, and coordinating care. Greater than 50% of this time was spent in counseling or coordinating care with the patient regarding goals of hospitalization, psycho-education, and discharge planning needs.   Past Psychiatric History: see H&P  Past Medical History:  Past Medical History:  Diagnosis Date   Anemia    History reviewed. No pertinent surgical history. Family History: History reviewed. No pertinent family history. Family Psychiatric  History: see H&P Social History:  Social History   Substance and Sexual Activity  Alcohol Use Not Currently     Social History   Substance and Sexual Activity  Drug Use Never    Social History   Socioeconomic History   Marital status: Single    Spouse name: Not on file   Number of children: Not on file   Years of education: Not on file   Highest education level: Not on file  Occupational History   Not on file  Tobacco Use   Smoking status: Never   Smokeless tobacco: Never  Substance and Sexual Activity   Alcohol use: Not Currently   Drug use: Never   Sexual activity: Not on file  Other Topics Concern   Not on file  Social  History Narrative   Not on file   Social Determinants of Health   Financial Resource Strain: Not on file  Food Insecurity: Not on file  Transportation Needs: Not on file  Physical Activity: Not on file  Stress: Not on file  Social Connections: Not on file   Additional Social History:                         Sleep:  Good  Appetite:  Fair  Current Medications: Current Facility-Administered Medications  Medication Dose Route Frequency Provider Last Rate Last Admin   acetaminophen (TYLENOL) tablet 650 mg  650 mg Oral Q4H PRN Jackelyn Poling, NP       alum & mag hydroxide-simeth (MAALOX/MYLANTA) 200-200-20 MG/5ML suspension 30 mL  30 mL Oral Q6H PRN Nira Conn A, NP       ARIPiprazole (ABILIFY) tablet 5 mg  5 mg Oral QHS Onuoha, Josephine C, NP   5 mg at 10/12/21 2110   docusate sodium (COLACE) capsule 100 mg  100 mg Oral BID PRN Roselle Locus, MD       ferrous sulfate tablet 325 mg  325 mg Oral Q breakfast Hill, Shelbie Hutching, MD   325 mg at 10/13/21 0754   multivitamin with minerals tablet 1 tablet  1 tablet Oral Daily Comer Locket, MD   1 tablet at 10/13/21 0754   ondansetron (ZOFRAN) tablet 4 mg  4 mg Oral Q8H PRN Jackelyn Poling, NP       topiramate (TOPAMAX) tablet 25 mg  25 mg Oral BID Dahlia Byes C, NP   25 mg at 10/13/21 1715   traZODone (DESYREL) tablet 50 mg  50 mg Oral QHS PRN Park Pope, MD       Vitamin D3 (Vitamin D) tablet 2,000 Units  2,000 Units Oral Daily Hill, Shelbie Hutching, MD   2,000 Units at 10/13/21 0754    Lab Results:  Results for orders placed or performed during the hospital encounter of 10/09/21 (from the past 48 hour(s))  Iron and TIBC     Status: Abnormal   Collection Time: 10/12/21  6:40 PM  Result Value Ref Range   Iron 22 (L) 28 - 170 ug/dL   TIBC 161 096 - 045 ug/dL   Saturation Ratios 5 (L) 10.4 - 31.8 %   UIBC 426 ug/dL    Comment: Performed at [facility], [address], [place], [place] [address]  Ferritin     Status: Abnormal   Collection Time: 10/12/21  6:40 PM  Result Value Ref Range   Ferritin 5 (L) 11 - 307 ng/mL    Comment: Performed at [facility], [address], [place], [place] [address]    Blood Alcohol level:  Lab Results  Component Value Date   ETH <10 10/08/2021    Metabolic  Disorder Labs: Lab Results  Component Value Date   HGBA1C 5.4 10/10/2021   MPG 108.28 10/10/2021   No results found for: PROLACTIN Lab Results  Component Value Date   CHOL 175 10/10/2021   TRIG 51 10/10/2021   HDL 26 (L) 10/10/2021   CHOLHDL 6.7 10/10/2021   VLDL 10 10/10/2021   LDLCALC 139 (H) 10/10/2021    Physical Findings:  Musculoskeletal: Strength & Muscle Tone: within normal limits Gait & Station: normal Patient leans: N/A  Psychiatric Specialty Exam:  Presentation  General Appearance: Appropriate for Environment; Well Groomed  Eye Contact:Good  Speech:Clear and  Coherent; Normal Rate  Speech Volume:Normal  Handedness:Right   Mood and Affect  Mood:Euthymic  Affect:Appropriate; Congruent   Thought Process  Thought Processes:Coherent; Goal Directed; Linear  Descriptions of Associations:Intact  Orientation:Full (Time, Place and Person)  Thought Content:Logical, denies AVH, paranoia, or delusions  History of Schizophrenia/Schizoaffective disorder:No  Duration of Psychotic Symptoms:No data recorded Hallucinations:Hallucinations: None  Ideas of Reference:None  Suicidal Thoughts:Suicidal Thoughts: No  Homicidal Thoughts:Homicidal Thoughts: No   Sensorium  Memory:Immediate Good; Recent Good; Remote Good  Judgment:Good  Insight:Good   Executive Functions  Concentration:Good  Attention Span:Good  Recall:Good  Fund of Knowledge:Good  Language:Good   Psychomotor Activity  Psychomotor Activity:Psychomotor Activity: Normal   Assets  Assets:Communication Skills; Desire for Improvement; Financial Resources/Insurance; Housing; Physical Health; Social Support   Sleep  Sleep:Sleep: Good Number of Hours of Sleep: 6.75    Physical Exam: Physical Exam Vitals and nursing note reviewed.  Constitutional:      Appearance: Normal appearance. She is normal weight.  HENT:     Head: Normocephalic and atraumatic.  Pulmonary:      Effort: Pulmonary effort is normal.  Neurological:     General: No focal deficit present.     Mental Status: She is oriented to person, place, and time.   Review of Systems  Respiratory:  Negative for shortness of breath.   Cardiovascular:  Negative for chest pain.  Gastrointestinal:  Negative for abdominal pain, constipation, diarrhea, heartburn, nausea and vomiting.  Neurological:  Negative for dizziness and headaches.  Blood pressure 117/87, pulse 93, temperature 98.1 F (36.7 C), resp. rate 20, height 5\' 8"  (1.727 m), weight 93 kg, SpO2 98 %. Body mass index is 31.17 kg/m.   Treatment Plan Summary: Daily contact with patient to assess and evaluate symptoms and progress in treatment and Medication management  ASSESSMENT Patient is a 20 year old female with history of bipolar disorder presenting to [facility] due to intentional overdose of Lamictal.  Patient appears to be doing better.  Patient appears to continue to be in a good mood.  Patient is eager for discharge tomorrow.  PLAN Psychiatric Problems Bipolar Disorder Abilify 5 mg po for mood stabilization  - Rechecking EKG for QTC monitoring on antipsychotic; A1c 5.4, lipids WNL except for HDL 26 and LDL 139 Topiramate 25 mg po bid for mood for mood stabilization Encourage group therapy participation Discussed potential start of an antidepressant and she declines at this time - she agrees to discuss with her outpatient provider after discharge Discussed that her mood stabilizers may need dose increase after discharge and she agrees to discuss with outpatient provider after discharge and declines further dose changes today  Medical Problems Flu shot received 10/22  Vitamin D Deficiency Vit D Level 11.41 Vitamin D 1000 units qd Multivitamin with minerals tablet  Microcytic Anemia likely 2/2 iron deficiency Mildly elevated platelet count Iron 22, Ferritin 5, TIBC 448 Ferrous Sulfate 325 mg qd Discussed need to see PCP for  management after discharge Will need to watch for constipation while on iron supplement and colace 100mg  daily PRN ordered  Elevated LDL Cholesterol LDL Cholesterol 139 -Recommend PCP to follow up  Cannabis use Counseled on need to abstain after discharge  PRNs Tylenol 650 mg for mild pain Maalox/Mylanta 30 mL for indigestion Colace 100 mg bid prn for mild constipation Hydroxyzine 25 mg tid for anxiety Milk of Magnesia 30 mL for constipation Trazodone 50 mg for sleep  3. Safety and Monitoring: Involuntary admission to inpatient psychiatric unit for safety, stabilization  and treatment Daily contact with patient to assess and evaluate symptoms and progress in treatment Patient's case to be discussed in multi-disciplinary team meeting Observation Level : q15 minute checks Vital signs: q12 hours Precautions: suicide, elopement, and assault   4. Discharge Planning: Social work and case management to assist with discharge planning and identification of hospital follow-up needs prior to discharge Estimated LOS: 5-7 days Discharge Concerns: Need to establish a safety plan; Medication compliance and effectiveness Discharge Goals: Return home with outpatient referrals for mental health follow-up including medication management/psychotherapy  Park Pope, MD 10/13/2021, 5:43 PM

## 2021-10-13 NOTE — Progress Notes (Signed)
   10/13/21 1100  Psych Admission Type (Psych Patients Only)  Admission Status Involuntary  Psychosocial Assessment  Patient Complaints None  Eye Contact Fair  Facial Expression Animated  Affect Appropriate to circumstance  Speech Logical/coherent  Interaction Assertive  Motor Activity Other (Comment) (WDL)  Appearance/Hygiene In scrubs  Behavior Characteristics Cooperative  Mood Pleasant  Thought Process  Coherency WDL  Content WDL  Delusions None reported or observed  Perception WDL  Hallucination None reported or observed  Judgment WDL  Confusion None  Danger to Self  Current suicidal ideation? Denies  Danger to Others  Danger to Others None reported or observed  Pt denies SI/HI/AVH.  Pt is pleasant and cooperative.  Pt denies need or concerns at this time.  Pt is anticipating discharge soon.  RN encouraged pt to reach out for assistance if needed.  Pt in agreement with this plan.  Pt remains safe on the unit with q 15 min checks in place.

## 2021-10-13 NOTE — [facility] Group Notes (Signed)
Adult Psychoeducational Group Note  Date:  10/13/2021 Time:  4:39 PM  Group Topic/Focus:  Managing Feelings:   The focus of this group is to identify what feelings patients have difficulty handling and develop a plan to handle them in a healthier way upon discharge.  Participation Level:  Active  Participation Quality:  Attentive  Affect:  Appropriate  Cognitive:  Alert  Insight: Appropriate  Engagement in Group:  Engaged  Modes of Intervention:  Discussion  Additional Comments:  Patient attended and participated in the Psycho-Ed group.  Jearl Klinefelter 10/13/2021, 4:39 PM

## 2021-10-13 NOTE — Progress Notes (Signed)
Patient rated her day as a 10 out of 10 since she met some new  peers and because she enjoyed the groups. Her goal for tomorrow is to get discharged.

## 2021-10-13 NOTE — Progress Notes (Signed)
Pt visible on the unit some, pt given PRN Trazodone with HS medication.    10/13/21 0000  Psych Admission Type (Psych Patients Only)  Admission Status Involuntary  Psychosocial Assessment  Patient Complaints None  Eye Contact Fair  Facial Expression Flat (brightens with conversation)  Affect Appropriate to circumstance  Speech Logical/coherent  Interaction Assertive  Motor Activity Other (Comment) (WDL)  Appearance/Hygiene In scrubs  Behavior Characteristics Cooperative  Mood Pleasant  Thought Process  Coherency WDL  Content WDL  Delusions None reported or observed  Perception WDL  Hallucination None reported or observed  Judgment Poor  Confusion None  Danger to Self  Current suicidal ideation? Denies  Danger to Others  Danger to Others None reported or observed

## 2021-10-14 LAB — VITAMIN B1: Vitamin B1 (Thiamine): 74.2 nmol/L (ref 66.5–200.0)

## 2021-10-14 MED ORDER — VITAMIN D3 25 MCG PO TABS: 1000.0000 [IU] | ORAL_TABLET | Freq: Every day | ORAL | 0 refills | Status: AC

## 2021-10-14 MED ORDER — FERROUS SULFATE 325 (65 FE) MG PO TABS: 325.0000 mg | ORAL_TABLET | Freq: Every day | ORAL | 0 refills | Status: AC

## 2021-10-14 MED ORDER — TRAZODONE HCL 50 MG PO TABS: 50.0000 mg | ORAL_TABLET | Freq: Every evening | ORAL | 0 refills | Status: AC | PRN

## 2021-10-14 MED ORDER — ARIPIPRAZOLE 5 MG PO TABS: 5.0000 mg | ORAL_TABLET | Freq: Every day | ORAL | 0 refills | Status: AC

## 2021-10-14 MED ORDER — TOPIRAMATE 25 MG PO TABS: 25.0000 mg | ORAL_TABLET | Freq: Two times a day (BID) | ORAL | 0 refills | Status: AC
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
## 2021-10-14 NOTE — Progress Notes (Signed)
  [facility] Adult Case Management Discharge Plan :  Will you be returning to the same living situation after discharge:  Yes,  Home  At discharge, do you have transportation home?: Yes,  Safe Transport  Do you have the ability to pay for your medications: Yes,  Insurance   Release of information consent forms completed and in the chart;  Patient's signature needed at discharge.  Patient to Follow up at:  Follow-up Information     [facility] Follow up on 10/19/2021.   Why: You are scheduled for an intake appointmentfor therapy and medication management on 10/19/2021 at 3:15pm.  This appointment will be held in-person.  Please bring your ID and proof of insurance with you. Contact information: Address: [address], [place], [place] [address]  Phone: [phone] Fax: [phone]                Next level of care provider has access to [facility] Link:no  Safety Planning and Suicide Prevention discussed: Yes,  with patient      Has patient been referred to the Quitline?: N/A patient is not a smoker  Patient has been referred for addiction treatment: N/A  [name], LCSWA 10/14/2021, 10:13 AM

## 2021-10-14 NOTE — Progress Notes (Addendum)
Discharge note: RN met with pt and reviewed pt's discharge instructions. Pt verbalized understanding of discharge instructions and pt did not have any questions. RN reviewed and provided pt with a copy of SRA, AVS and Transition Record.  RN returned pt's belongings to pt. Pt denied SI/HI/AVH and voiced no concerns. Pt was appreciative of the care pt received at [facility]. Patient was ready and excited to go back to her apartment and school. Patient discharged to the lobby without incident.
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
## 2021-10-14 NOTE — Group Note (Signed)
Recreation Therapy Group Note   Group Topic:Stress Management  Group Date: 10/14/2021 Start Time: 0935 End Time: 0953 Facilitators: Caroll Rancher, LRT/CTRS Location: [address] Dayroom   Goal Area(s) Addresses:  Patient will actively participate in stress management techniques presented during session.  Patient will successfully identify benefit of practicing stress management post d/c.    Group Description: Guided Imagery. LRT provided education, instruction, and demonstration on practice of visualization via guided imagery. Patient was asked to participate in the technique introduced during session. Patients were given suggestions of ways to access scripts post d/c and encouraged to explore Youtube and other apps available on smartphones, tablets, and computers.   Affect/Mood: Appropriate   Participation Level: Engaged   Participation Quality: Independent   Behavior: Appropriate   Speech/Thought Process: Focused   Insight: Good   Judgement: Good   Modes of Intervention: [employer], Script   Patient Response to Interventions:  Engaged   Education Outcome:  Acknowledges education and In group clarification offered    Clinical Observations/Individualized Feedback: Pt was attentive to activity until peer became a distraction.  Pt focused as much as possible during activity.    Plan: Continue to engage patient in RT group sessions 2-3x/week.   Caroll Rancher, LRT/CTRS 10/14/2021 12:12 PM

## 2022-10-18 IMAGING — DX DG CHEST 1V PORT
1 series · 1 of 1 positions shown · non-contrast
Comparison: None.

CLINICAL DATA: Cough

EXAM:
PORTABLE CHEST 1 VIEW

[chest ap]
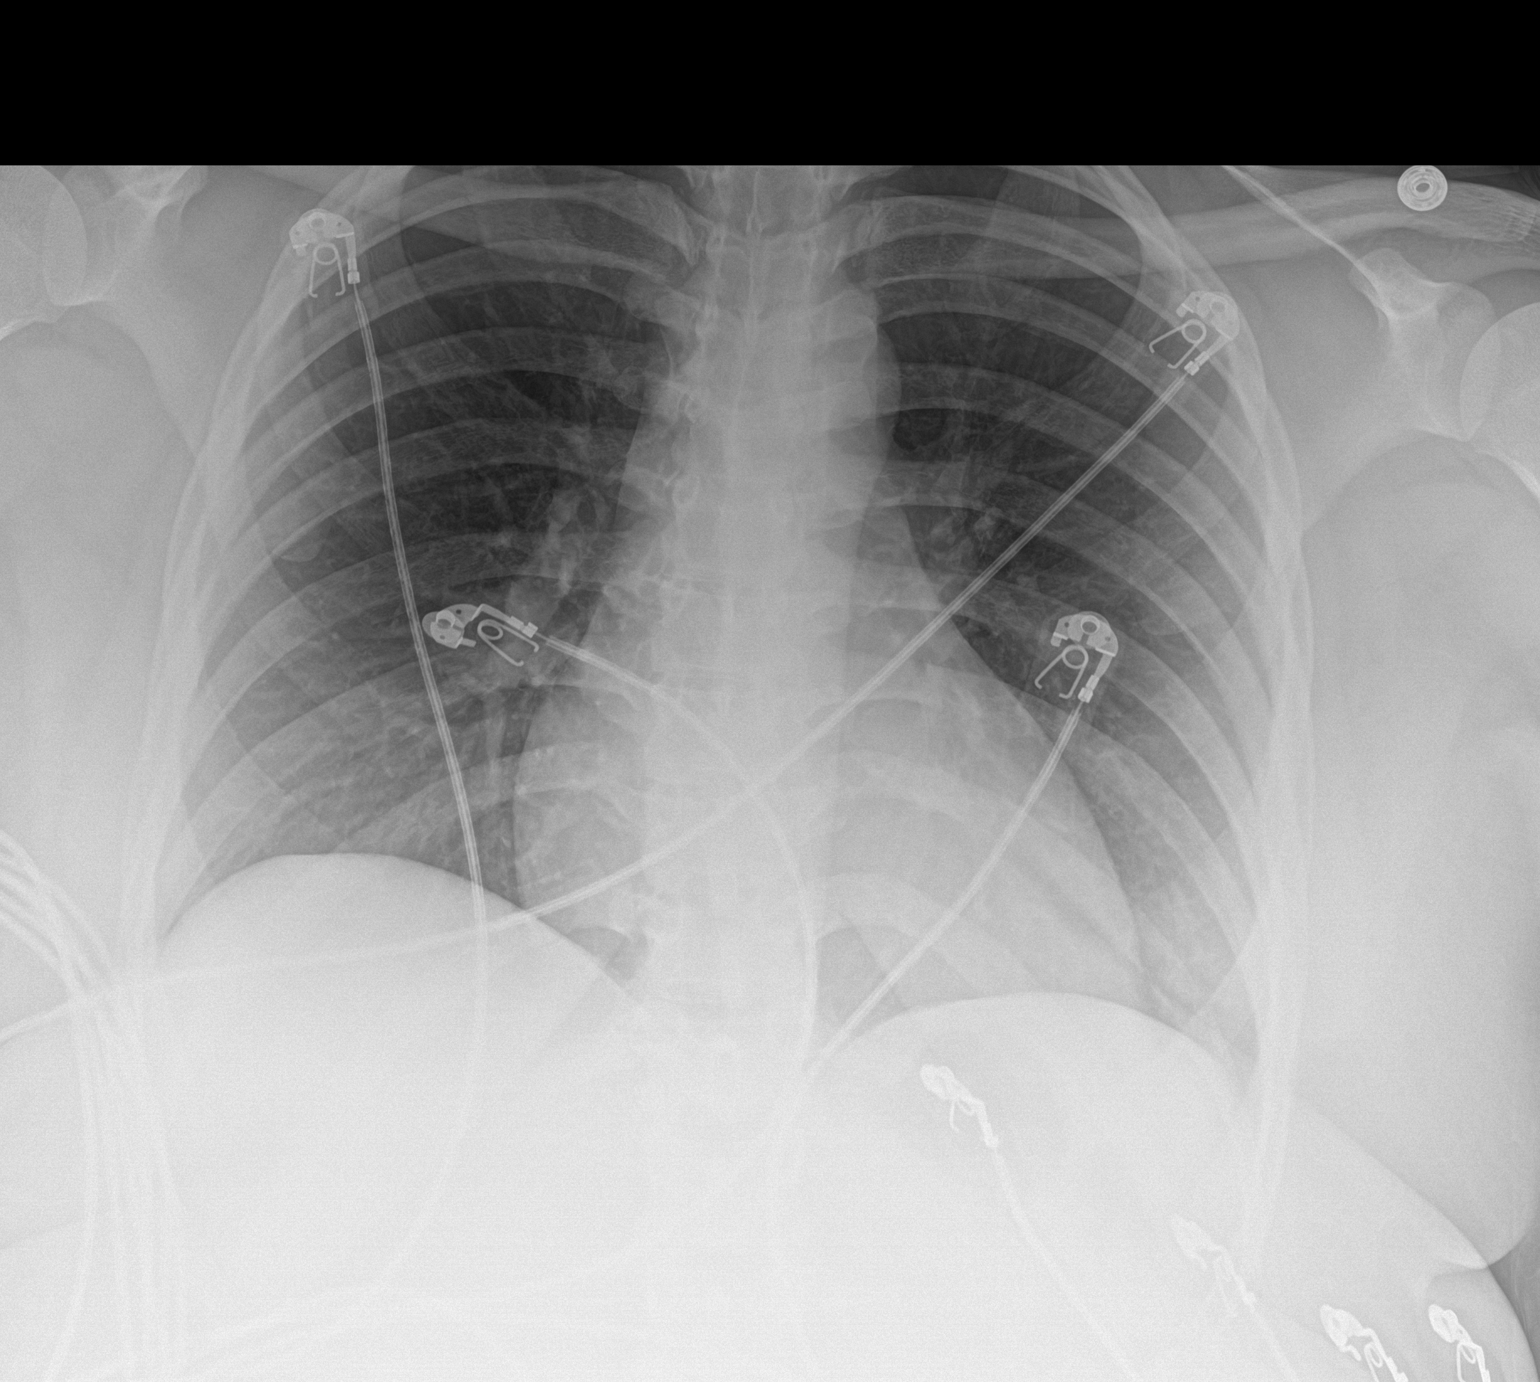

[1 of 1 positions shown; findings below may reference images not displayed]

FINDINGS: The heart size and mediastinal contours are within normal limits. No
focal airspace consolidation, pleural effusion, or pneumothorax. The
visualized skeletal structures are unremarkable.
IMPRESSION: No active disease.
